# Patient Record
Sex: Male | Born: 2000 | Race: White | Hispanic: No | Marital: Single | Smoking: Never smoker
Health system: Southern US, Community
[De-identification: ages and names within clinical notes are randomized; demographics above are authoritative.]

## PROBLEM LIST (undated history)

## (undated) DIAGNOSIS — N289 Disorder of kidney and ureter, unspecified: Secondary | ICD-10-CM

## (undated) DIAGNOSIS — J45909 Unspecified asthma, uncomplicated: Secondary | ICD-10-CM

## (undated) HISTORY — PX: WISDOM TOOTH EXTRACTION: SHX21

---
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
## 2020-06-09 ENCOUNTER — Other Ambulatory Visit: Payer: Self-pay

## 2020-06-09 ENCOUNTER — Ambulatory Visit
Admission: EM | Admit: 2020-06-09 | Discharge: 2020-06-09 | Disposition: A | Discharge status: Home / Self Care | Payer: Managed Care, Other (non HMO) | Attending: Emergency Medicine | Admitting: Emergency Medicine

## 2020-06-09 ENCOUNTER — Encounter: Payer: Self-pay | Admitting: Emergency Medicine

## 2020-06-09 ENCOUNTER — Ambulatory Visit: Payer: Self-pay

## 2020-06-09 ENCOUNTER — Encounter: Payer: Self-pay | Admitting: Family Medicine

## 2020-06-09 DIAGNOSIS — R6889 Other general symptoms and signs: Secondary | ICD-10-CM | POA: Diagnosis not present

## 2020-06-09 HISTORY — DX: Unspecified asthma, uncomplicated: J45.909

## 2020-06-09 LAB — POCT URINALYSIS DIP (MANUAL ENTRY)
Bilirubin, UA: NEGATIVE
Glucose, UA: NEGATIVE mg/dL
Ketones, POC UA: NEGATIVE mg/dL
Leukocytes, UA: NEGATIVE
Nitrite, UA: NEGATIVE
Protein Ur, POC: 30 mg/dL — AB
Spec Grav, UA: 1.025 (ref 1.010–1.025)
Urobilinogen, UA: 1 E.U./dL
pH, UA: 5.5 (ref 5.0–8.0)

## 2020-06-09 NOTE — ED Provider Notes (Signed)
[facility]   [id number] 06/09/20 Arrival Time: 0946   CC: Flu like symptoms  SUBJECTIVE: History from: patient and mother  Little Winton is a 19 y.o. male who presents with fever, tmax of 101, chills and generalized body aches x 1 day.  Patient tested positive for COVID in March.  Denies known sick contacts or exposure.  Has tried tylenol with relief.  Denies previous symptoms in the past.   Denies sinus pain, rhinorrhea, sore throat, cough,  SOB, wheezing, chest pain, nausea, abdominal pain, changes in bowel or bladder habits, tick bite.     ROS: As per HPI.  All other pertinent ROS negative.     Past Medical History:  Diagnosis Date  . Asthma    History reviewed. No pertinent surgical history. Allergies  Allergen Reactions  . Wasp Venom Anaphylaxis  . Amoxicillin Hives  . Ibuprofen Other (See Comments)    Finger tips swell    No current facility-administered medications on file prior to encounter.   No current outpatient medications on file prior to encounter.   Social History   Socioeconomic History  . Marital status: Single    Spouse name: Not on file  . Number of children: Not on file  . Years of education: Not on file  . Highest education level: Not on file  Occupational History  . Not on file  Tobacco Use  . Smoking status: Never Smoker  . Smokeless tobacco: Never Used  Substance and Sexual Activity  . Alcohol use: Never  . Drug use: Never  . Sexual activity: Not on file  Other Topics Concern  . Not on file  Social History Narrative  . Not on file   Social Determinants of Health   Financial Resource Strain:   . Difficulty of Paying Living Expenses:   Food Insecurity:   . Worried About Programme researcher, broadcasting/film/video in the Last Year:   . Barista in the Last Year:   Transportation Needs:   . Freight forwarder (Medical):   Marland Kitchen Lack of Transportation (Non-Medical):   Physical Activity:   . Days of Exercise per Week:   . Minutes of  Exercise per Session:   Stress:   . Feeling of Stress :   Social Connections:   . Frequency of Communication with Friends and Family:   . Frequency of Social Gatherings with Friends and Family:   . Attends Religious Services:   . Active Member of Clubs or Organizations:   . Attends Banker Meetings:   Marland Kitchen Marital Status:   Intimate Partner Violence:   . Fear of Current or Ex-Partner:   . Emotionally Abused:   Marland Kitchen Physically Abused:   . Sexually Abused:    No family history on file.  OBJECTIVE:  Vitals:   06/09/20 1006 06/09/20 1009  BP:  104/68  Pulse:  85  Resp:  18  Temp:  99.5 F (37.5 C)  TempSrc:  Oral  SpO2:  97%  Weight: 150 lb (68 kg)   Height: 6\' 1"  (1.854 m)      General appearance: alert; appears mildly fatigued, but nontoxic; speaking in full sentences and tolerating own secretions HEENT: NCAT; Ears: EACs clear, TMs pearly gray; Eyes: PERRL.  EOM grossly intact. Nose: nares patent without rhinorrhea, Throat: oropharynx clear, tonsils non erythematous or enlarged, uvula midline  Neck: supple without LAD Lungs: unlabored respirations, symmetrical air entry; cough: absent; no respiratory distress; CTAB Heart: regular rate and rhythm.  Skin: warm and dry Psychological: alert and cooperative; normal mood and affect  LABS:  Results for orders placed or performed during the hospital encounter of 06/09/20 (from the past 24 hour(s))  POCT urinalysis dipstick     Status: Abnormal   Collection Time: 06/09/20 10:26 AM  Result Value Ref Range   Color, UA straw (A) yellow   Clarity, UA clear clear   Glucose, UA negative negative mg/dL   Bilirubin, UA negative negative   Ketones, POC UA negative negative mg/dL   Spec Grav, UA 4.132 4.401 - 1.025   Blood, UA trace-intact (A) negative   pH, UA 5.5 5.0 - 8.0   Protein Ur, POC =30 (A) negative mg/dL   Urobilinogen, UA 1.0 0.2 or 1.0 E.U./dL   Nitrite, UA Negative Negative   Leukocytes, UA Negative Negative      ASSESSMENT & PLAN:  1. Flu-like symptoms    Urine did not show signs of infection today.  It showed some protein and trace blood, possibly secondary to dehydration Please follow up with PCP in 1-2 weeks to have urine rechecked Get plenty of rest and push fluids Supplement with pedialyte OTC as needed Continue with tylenol for fever Follow up with PCP as needed Call or go to the ED if you have any new or worsening symptoms such as worsening fever, cough, shortness of breath, chest tightness, chest pain, turning blue, changes in mental status, abdominal pain, changes in urinary or bowel habits, etc...   Reviewed expectations re: course of current medical issues. Questions answered. Outlined signs and symptoms indicating need for more acute intervention. Patient verbalized understanding. After Visit Summary given.         Rennis Harding, PA-C 06/09/20 1053

## 2020-06-09 NOTE — ED Triage Notes (Addendum)
Fever, chill and body aches that started Tue morning.  Pt had covid in March

## 2020-06-09 NOTE — Discharge Instructions (Signed)
Urine did not show signs of infection today.  It showed some protein and trace blood, possibly secondary to dehydration Please follow up with PCP in 1-2 weeks to have urine rechecked Get plenty of rest and push fluids Supplement with pedialyte OTC as needed Continue with tylenol for fever Follow up with PCP as needed Call or go to the ED if you have any new or worsening symptoms such as worsening fever, cough, shortness of breath, chest tightness, chest pain, turning blue, changes in mental status, abdominal pain, changes in urinary or bowel habits, etc..Marland Kitchen

## 2020-06-10 ENCOUNTER — Other Ambulatory Visit: Payer: Self-pay

## 2020-06-10 ENCOUNTER — Ambulatory Visit
Admission: EM | Admit: 2020-06-10 | Discharge: 2020-06-10 | Disposition: A | Discharge status: Home / Self Care | Payer: Managed Care, Other (non HMO) | Source: Home / Self Care

## 2020-06-10 ENCOUNTER — Emergency Department (HOSPITAL_COMMUNITY): Payer: Managed Care, Other (non HMO)

## 2020-06-10 ENCOUNTER — Encounter (HOSPITAL_COMMUNITY): Payer: Self-pay | Admitting: Emergency Medicine

## 2020-06-10 ENCOUNTER — Emergency Department (HOSPITAL_COMMUNITY)
Admission: EM | Admit: 2020-06-10 | Discharge: 2020-06-11 | Disposition: A | Discharge status: Home / Self Care | Payer: Managed Care, Other (non HMO) | Attending: Emergency Medicine | Admitting: Emergency Medicine

## 2020-06-10 DIAGNOSIS — N50819 Testicular pain, unspecified: Secondary | ICD-10-CM

## 2020-06-10 DIAGNOSIS — Z20822 Contact with and (suspected) exposure to covid-19: Secondary | ICD-10-CM | POA: Diagnosis not present

## 2020-06-10 DIAGNOSIS — N50811 Right testicular pain: Secondary | ICD-10-CM | POA: Insufficient documentation

## 2020-06-10 DIAGNOSIS — H6121 Impacted cerumen, right ear: Secondary | ICD-10-CM | POA: Diagnosis not present

## 2020-06-10 DIAGNOSIS — B349 Viral infection, unspecified: Secondary | ICD-10-CM | POA: Diagnosis not present

## 2020-06-10 DIAGNOSIS — J45909 Unspecified asthma, uncomplicated: Secondary | ICD-10-CM | POA: Insufficient documentation

## 2020-06-10 DIAGNOSIS — R509 Fever, unspecified: Secondary | ICD-10-CM | POA: Diagnosis not present

## 2020-06-10 DIAGNOSIS — N50812 Left testicular pain: Secondary | ICD-10-CM | POA: Diagnosis not present

## 2020-06-10 LAB — SARS CORONAVIRUS 2 BY RT PCR (HOSPITAL ORDER, PERFORMED IN ~~LOC~~ HOSPITAL LAB): SARS Coronavirus 2: NEGATIVE

## 2020-06-10 NOTE — ED Triage Notes (Signed)
Patient states that he has been sick over the last few days with body aches and fever. Chief complaint is testicular pain to the left side. Patient states that the area is red.

## 2020-06-10 NOTE — ED Triage Notes (Signed)
Patient complaining of testicular pain and worsening symptoms since last visit. Per provider Avegno, patient advised to seek care at the ED for further evaluation and possible ultrasound. Patient and dad verbalized understanding. Denied ambulance, dad driving patient to ED. Patient in no acute distress, ambulatory.

## 2020-06-10 NOTE — ED Provider Notes (Signed)
[facility] EMERGENCY DEPARTMENT Provider Note   CSN: [id number] Arrival date & time: 06/10/20  2012     History Chief Complaint  Patient presents with  . testicular pain    Mark Irwin is a 19 y.o. male.  Patient with past medical history of asthma.  States 2 days of intermittent body aches, chills, fatigue, fever up to 102.5.  Did test positive for Covid in March.  He has had no sick contacts or exposure.  Sent from urgent care today with increasing bilateral testicular pain over the past 2 days.  He believes he has been sore there all along.  Still having aches all over and chills.  Denies any pain with urination or blood in the urine.  No abdominal pain or back pain.  No vomiting.  No penile discharge.  He is not sexually active and never has been.  No known tick bites or exposures.  He was sent from urgent care again today with worsening aches and bilateral testicular pain.  He believes his testicles more red than usual.  Left side is more painful than the right.  Denies abdominal pain or back pain.  Denies any cough, sore throat, runny nose or fever.  No known tick bites.  The history is provided by the patient.       Past Medical History:  Diagnosis Date  . Asthma   . Renal disorder    kidney stone    Patient Active Problem List   Diagnosis Date Noted  . Kidney stone 10/31/2016    History reviewed. No pertinent surgical history.     History reviewed. No pertinent family history.  Social History   Tobacco Use  . Smoking status: Never Smoker  . Smokeless tobacco: Never Used  Substance Use Topics  . Alcohol use: Never  . Drug use: Never    Home Medications Prior to Admission medications   Medication Sig Start Date End Date Taking? Authorizing Provider  albuterol (PROVENTIL HFA;VENTOLIN HFA) 108 (90 Base) MCG/ACT inhaler Two sprays Q 4-6 prn prn wheezing. 02/09/19   Merlyn Albert, MD  HYDROcodone-acetaminophen (NORCO/VICODIN) 5-325 MG tablet Take one  every 6 hours for pain not helped by motrin Patient not taking: Reported on 02/09/2019 07/21/17   Bethann Berkshire, MD  ibuprofen (ADVIL,MOTRIN) 800 MG tablet Take 1 tablet (800 mg total) by mouth every 8 (eight) hours as needed for moderate pain. Patient not taking: Reported on 02/09/2019 07/21/17   Bethann Berkshire, MD  loratadine (CLARITIN) 10 MG tablet Take 10 mg by mouth daily.    [provider]  ondansetron (ZOFRAN ODT) 4 MG disintegrating tablet 4mg  ODT q4 hours prn nausea/vomit 07/21/17   07/23/17, MD    Allergies    Bee venom, Wasp venom, Amoxil [amoxicillin], Cats claw [uncaria tomentosa (cats claw)], and Ibuprofen  Review of Systems   Review of Systems  Constitutional: Positive for activity change, appetite change, chills, fatigue and fever.  HENT: Negative for congestion, rhinorrhea, sore throat and trouble swallowing.   Respiratory: Negative for cough, chest tightness and shortness of breath.   Cardiovascular: Negative for chest pain and leg swelling.  Gastrointestinal: Negative for abdominal pain, nausea, rectal pain and vomiting.  Genitourinary: Positive for testicular pain. Negative for dysuria and hematuria.  Musculoskeletal: Positive for arthralgias and myalgias.  Skin: Negative for rash.  Neurological: Positive for weakness. Negative for dizziness and headaches.   all other systems are negative except as noted in the HPI and PMH.  Physical Exam Updated Vital Signs BP (!) 142/85 (BP Location: Right Arm)   Pulse 90   Temp 99.4 F (37.4 C) (Oral)   Resp 18   Ht 6\' 1"  (1.854 m)   Wt 68 kg   SpO2 100%   BMI 19.79 kg/m   Physical Exam Vitals and nursing note reviewed.  Constitutional:      General: He is not in acute distress.    Appearance: Normal appearance. He is well-developed and normal weight. He is not ill-appearing.  HENT:     Head: Normocephalic and atraumatic.     Ears:     Comments: Cerumen impaction on right, left TM is clear    Nose:  No rhinorrhea.     Mouth/Throat:     Mouth: Mucous membranes are moist.     Pharynx: No oropharyngeal exudate or posterior oropharyngeal erythema.     Comments: Uvula midline, no exudates or asymmetry Eyes:     Conjunctiva/sclera: Conjunctivae normal.     Pupils: Pupils are equal, round, and reactive to light.  Neck:     Comments: No meningismus. Cardiovascular:     Rate and Rhythm: Normal rate and regular rhythm.     Heart sounds: Normal heart sounds. No murmur heard.   Pulmonary:     Effort: Pulmonary effort is normal. No respiratory distress.     Breath sounds: Normal breath sounds.  Abdominal:     Palpations: Abdomen is soft.     Tenderness: There is no abdominal tenderness. There is no guarding or rebound.  Genitourinary:    Comments: Testicles normal to inspection.  Diffusely tender.  Mild diffuse erythema of scrotum. No palpable lymph nodes. Musculoskeletal:        General: No tenderness. Normal range of motion.     Cervical back: Normal range of motion and neck supple.  Skin:    General: Skin is warm.     Capillary Refill: Capillary refill takes less than 2 seconds.  Neurological:     General: No focal deficit present.     Mental Status: He is alert and oriented to person, place, and time. Mental status is at baseline.     Cranial Nerves: No cranial nerve deficit.     Motor: No abnormal muscle tone.     Coordination: Coordination normal.     Comments: No ataxia on finger to nose bilaterally. No pronator drift. 5/5 strength throughout. CN 2-12 intact.Equal grip strength. Sensation intact.   Psychiatric:        Behavior: Behavior normal.     ED Results / Procedures / Treatments   Labs (all labs ordered are listed, but only abnormal results are displayed) Labs Reviewed  CBC WITH DIFFERENTIAL/PLATELET - Abnormal; Notable for the following components:      Result Value   WBC 2.6 (*)    Platelets 137 (*)    Neutro Abs 1.6 (*)    Lymphs Abs 0.6 (*)    All other  components within normal limits  COMPREHENSIVE METABOLIC PANEL - Abnormal; Notable for the following components:   Glucose, Bld 115 (*)    All other components within normal limits  SARS CORONAVIRUS 2 BY RT PCR (HOSPITAL ORDER, PERFORMED IN [facility])  CULTURE, BLOOD (ROUTINE X 2)  CULTURE, BLOOD (ROUTINE X 2)  URINE CULTURE  URINALYSIS, ROUTINE W REFLEX MICROSCOPIC  MONONUCLEOSIS SCREEN  LACTIC ACID, PLASMA  ROCKY MTN SPOTTED FVR ABS PNL(IGG+IGM)  B. BURGDORFI ANTIBODIES  MUMPS ANTIBODY, IGG  MUMPS ANTIBODY,  IGM    EKG None  Radiology DG Chest 2 View  Result Date: 06/11/2020 CLINICAL DATA:  Body aches and fever x several days. EXAM: CHEST - 2 VIEW COMPARISON:  None. FINDINGS: There is no evidence of acute infiltrate, pleural effusion or pneumothorax. The heart size and mediastinal contours are within normal limits. The visualized skeletal structures are unremarkable. IMPRESSION: No active cardiopulmonary disease. Electronically Signed   By: Aram Candelahaddeus  Houston M.D.   On: 06/11/2020 00:58   US SCROTUM W/DOPPLER  Result Date: 06/11/2020 CLINICAL DATA:  Bilateral testicular pain, left greater than right. EXAM: SCROTAL ULTRASOUND DOPPLER ULTRASOUND OF THE TESTICLES TECHNIQUE: Complete ultrasound examination of the testicles, epididymis, and other scrotal structures was performed. Color and spectral Doppler ultrasound were also utilized to evaluate blood flow to the testicles. COMPARISON:  None. FINDINGS: Right testicle Measurements: 5.8 cm x 2.1 cm x 3.0 cm. No mass or microlithiasis visualized. Left testicle Measurements: 5.6 cm x 2.4 cm x 3.3 cm. No mass or microlithiasis visualized. Right epididymis: A 0.3 cm x 0.2 cm x 0.2 cm anechoic structure is seen within the right epididymis. Left epididymis:  Normal in size and appearance. Hydrocele:  Very small bilateral hydroceles are noted. Varicocele:  Bilateral varicoceles are present. Pulsed Doppler interrogation of both testes  demonstrates normal low resistance arterial and venous waveforms bilaterally. IMPRESSION: 1. Small right epididymal cyst. 2. Very small bilateral hydroceles. 3. Bilateral varicoceles. Electronically Signed   By: Aram Candelahaddeus  Houston M.D.   On: 06/11/2020 03:22    Procedures Procedures (including critical care time)  Medications Ordered in ED Medications - No data to display  ED Course  I have reviewed the triage vital signs and the nursing notes.  Pertinent labs & imaging results that were available during my care of the patient were reviewed by me and considered in my medical decision making (see chart for details).    MDM Rules/Calculators/A&P                         2 days of body aches, chills, fever to 102.  Now with bilateral testicular pain.  No pain with urination or blood in the urine. Did have COVID in March. Nontoxic-appearing   Nontoxic.  No meningismus.  Labs are reassuring with mild leukopenia.  Mild thrombocytopenia likely consistent with viral infection. No neutropenia. Urinalysis is negative.  Testicular ultrasound as above with no evidence of testicular torsion or epididymitis.  Small varicocele and hydrocele seen  Uncertain etiology of aches and fever.  Patient up-to-date on mumps vaccine.  Monospot is negative.  Blood and urine cultures are sent.  Will add on tickborne illnesses.  Patient appears well and nontoxic.  Blood cultures, rocky [place] spotted fever, Lyme disease titers are sent.  Mumps titers are sent. Will treat empirically for possible epididymitis with Rocephin and doxycycline. He denies sexual activity.   Treat supportively for suspected viral syndrome. PO hydration, antipyretics at home. Empiric antibiotics for possible component of epididymitis. UA negative. Prostatitis considered but seems less likely at this time. He declines prostate exam. He is aware of pending blood work. He is aware he may be called if results are positive. May follow results  online with  [facility]. Followup with PCP.  Return precautions discussed.   Gaynell FaceJonah S Allington was evaluated in Emergency Department on 06/10/2020 for the symptoms described in the history of present illness. He was evaluated in the context of the global COVID-19 pandemic, which necessitated consideration that  the patient might be at risk for infection with the SARS-CoV-2 virus that causes COVID-19. Institutional protocols and algorithms that pertain to the evaluation of patients at risk for COVID-19 are in a state of rapid change based on information released by regulatory bodies including the CDC and federal and state organizations. These policies and algorithms were followed during the patient's care in the ED.  Final Clinical Impression(s) / ED Diagnoses Final diagnoses:  Testicular pain  Viral syndrome  Fever, unspecified fever cause    Rx / DC Orders ED Discharge Orders    None       Suren Payne, Jeannett Senior, MD 06/11/20 [phone]

## 2020-06-11 ENCOUNTER — Emergency Department (HOSPITAL_COMMUNITY): Payer: Managed Care, Other (non HMO)

## 2020-06-11 LAB — COMPREHENSIVE METABOLIC PANEL
ALT: 21 U/L (ref 0–44)
AST: 32 U/L (ref 15–41)
Albumin: 4.3 g/dL (ref 3.5–5.0)
Alkaline Phosphatase: 63 U/L (ref 38–126)
Anion gap: 9 (ref 5–15)
BUN: 10 mg/dL (ref 6–20)
CO2: 28 mmol/L (ref 22–32)
Calcium: 9.2 mg/dL (ref 8.9–10.3)
Chloride: 100 mmol/L (ref 98–111)
Creatinine, Ser: 0.85 mg/dL (ref 0.61–1.24)
GFR calc Af Amer: >60 mL/min (ref >60)
GFR calc non Af Amer: >60 mL/min (ref >60)
Glucose, Bld: 115 mg/dL — ABNORMAL HIGH (ref 70–99)
Potassium: 3.9 mmol/L (ref 3.5–5.1)
Sodium: 137 mmol/L (ref 135–145)
Total Bilirubin: 1 mg/dL (ref 0.3–1.2)
Total Protein: 7.4 g/dL (ref 6.5–8.1)

## 2020-06-11 LAB — CBC WITH DIFFERENTIAL/PLATELET
Abs Immature Granulocytes: 0 10*3/uL (ref 0.00–0.07)
Basophils Absolute: 0 10*3/uL (ref 0.0–0.1)
Basophils Relative: 0 %
Eosinophils Absolute: 0 10*3/uL (ref 0.0–0.5)
Eosinophils Relative: 0 %
HCT: 42.7 % (ref 39.0–52.0)
Hemoglobin: 14.2 g/dL (ref 13.0–17.0)
Immature Granulocytes: 0 %
Lymphocytes Relative: 22 %
Lymphs Abs: 0.6 10*3/uL — ABNORMAL LOW (ref 0.7–4.0)
MCH: 29.6 pg (ref 26.0–34.0)
MCHC: 33.3 g/dL (ref 30.0–36.0)
MCV: 89.1 fL (ref 80.0–100.0)
Monocytes Absolute: 0.4 10*3/uL (ref 0.1–1.0)
Monocytes Relative: 15 %
Neutro Abs: 1.6 10*3/uL — ABNORMAL LOW (ref 1.7–7.7)
Neutrophils Relative %: 63 %
Platelets: 137 10*3/uL — ABNORMAL LOW (ref 150–400)
RBC: 4.79 MIL/uL (ref 4.22–5.81)
RDW: 12.6 % (ref 11.5–15.5)
WBC: 2.6 10*3/uL — ABNORMAL LOW (ref 4.0–10.5)
nRBC: 0 % (ref 0.0–0.2)

## 2020-06-11 LAB — URINALYSIS, ROUTINE W REFLEX MICROSCOPIC
Bilirubin Urine: NEGATIVE
Glucose, UA: NEGATIVE mg/dL
Hgb urine dipstick: NEGATIVE
Ketones, ur: NEGATIVE mg/dL
Leukocytes,Ua: NEGATIVE
Nitrite: NEGATIVE
Protein, ur: NEGATIVE mg/dL
Specific Gravity, Urine: 1.016 (ref 1.005–1.030)
pH: 6 (ref 5.0–8.0)

## 2020-06-11 LAB — MONONUCLEOSIS SCREEN: Mono Screen: NEGATIVE

## 2020-06-11 LAB — LACTIC ACID, PLASMA: Lactic Acid, Venous: 0.8 mmol/L (ref 0.5–1.9)

## 2020-06-11 MED ORDER — CEFTRIAXONE SODIUM 500 MG IJ SOLR
500.0000 mg | Freq: Once | INTRAMUSCULAR | Status: AC
  Administered 2020-06-11: 500 mg via INTRAMUSCULAR
  Filled 2020-06-11: qty 500

## 2020-06-11 MED ORDER — DOXYCYCLINE HYCLATE 100 MG PO CAPS
100.0000 mg | ORAL_CAPSULE | Freq: Two times a day (BID) | ORAL | 0 refills | Status: DC
Start: 2020-06-11 — End: 2020-06-15

## 2020-06-11 MED ORDER — STERILE WATER FOR INJECTION IJ SOLN
INTRAMUSCULAR | Status: AC
  Filled 2020-06-11: qty 10

## 2020-06-11 MED ORDER — DOXYCYCLINE HYCLATE 100 MG PO TABS
100.0000 mg | ORAL_TABLET | Freq: Once | ORAL | Status: AC
  Administered 2020-06-11: 100 mg via ORAL
  Filled 2020-06-11: qty 1

## 2020-06-11 NOTE — Discharge Instructions (Addendum)
take the antibiotics as prescribed.  Keep yourself hydrated.  Use Tylenol as needed for fever.  You can check online for the results of your mumps tests, [facility] spotted fever and Lyme disease tests. Return to the ED with worsening symptoms including pain, persistent fever, vomiting, any other concerns.

## 2020-06-12 LAB — URINE CULTURE: Culture: NO GROWTH

## 2020-06-12 LAB — MUMPS ANTIBODY, IGG: Mumps IgG: 60.8 AU/mL (ref >10.9)

## 2020-06-13 LAB — B. BURGDORFI ANTIBODIES: B burgdorferi Ab IgG+IgM: <0.91 {ISR} (ref 0.00–0.90)

## 2020-06-13 LAB — MUMPS ANTIBODY, IGM: Mumps IgM: <0.8 AU (ref 0.00–0.79)

## 2020-06-14 LAB — ROCKY MTN SPOTTED FVR ABS PNL(IGG+IGM)
RMSF IgG: NEGATIVE
RMSF IgM: 0.16 index (ref 0.00–0.89)

## 2020-06-15 ENCOUNTER — Ambulatory Visit (INDEPENDENT_AMBULATORY_CARE_PROVIDER_SITE_OTHER): Payer: Managed Care, Other (non HMO) | Admitting: Family Medicine

## 2020-06-15 ENCOUNTER — Other Ambulatory Visit: Payer: Self-pay

## 2020-06-15 DIAGNOSIS — R21 Rash and other nonspecific skin eruption: Secondary | ICD-10-CM | POA: Diagnosis not present

## 2020-06-15 DIAGNOSIS — T50905A Adverse effect of unspecified drugs, medicaments and biological substances, initial encounter: Secondary | ICD-10-CM

## 2020-06-15 NOTE — Progress Notes (Signed)
   Subjective:    Patient ID: Mark Irwin, male    DOB: [birth year], 19 y.o.   MRN: [id number]  Patient comes in today after ER visit over the weekend for testicle pain, fever and chills.  Patient was given doxycyline and took 1 dose Saturday and broke out in hives on his back, stomach and right arm and stopped abx. Today patient has no complaints of pain, fever and hives have cleared up.  ER note reviewed 's lab reviewed Patient did break out with rash after doxycycline Hard to know if that was the cause or not Patient does not think it was anything else Following day body aches went away testicular pain went away he did receive a ceftriaxone shot unlikely because of this but cannot rule it out completely Review of Systems     Objective:   Physical Exam  Today lungs clear respiratory rate normal heart regular no murmurs abdomen soft no rash noted Testicular exam normal epididymis normal no swelling no tenderness     Assessment & Plan:  Testicular discomfort resolved Hives reviewed resolved Possibly related to doxycycline If progressive troubles or worse to follow-up Warning signs discussed Wellness exam recommended this fall  If patient was in a pinch he could do doxycycline but if it causes rash again he would never be able to use it

## 2020-06-16 LAB — CULTURE, BLOOD (ROUTINE X 2)
Culture: NO GROWTH
Culture: NO GROWTH
Special Requests: ADEQUATE
Special Requests: ADEQUATE

## 2020-06-23 ENCOUNTER — Telehealth: Payer: Self-pay | Admitting: Family Medicine

## 2020-06-23 NOTE — Telephone Encounter (Signed)
Patient went to ER on 7/16 with private area issues and given antibiotic. He follow up here on 7/21 with same issues but had hives. Now patient has more discomfort in private area and wanting a follow up visit. He started back taking antibiotic from ER to help with discomfort. Please advise

## 2020-06-23 NOTE — Telephone Encounter (Signed)
Please advise where to put patient next week all appointment are blocked for now

## 2020-06-23 NOTE — Telephone Encounter (Signed)
May be given an open slot for tomorrow

## 2020-06-23 NOTE — Telephone Encounter (Signed)
Please contact patient and schedule appt. Thank you.

## 2020-06-24 NOTE — Telephone Encounter (Signed)
Spoke to Dr. Lorin Picket and patient scheduled for next week.

## 2020-06-24 NOTE — Telephone Encounter (Signed)
Has appointment 8/2 at 3:30

## 2020-06-27 ENCOUNTER — Ambulatory Visit (INDEPENDENT_AMBULATORY_CARE_PROVIDER_SITE_OTHER): Payer: Managed Care, Other (non HMO) | Admitting: Family Medicine

## 2020-06-27 ENCOUNTER — Other Ambulatory Visit: Payer: Self-pay

## 2020-06-27 VITALS — BP 110/68 | Temp 98.0°F | Wt 149.6 lb

## 2020-06-27 DIAGNOSIS — R300 Dysuria: Secondary | ICD-10-CM

## 2020-06-27 NOTE — Progress Notes (Signed)
   Subjective:    Patient ID: Mark Irwin, male    DOB: Jun 20, 2001, 19 y.o.   MRN: [id number]  HPI Patient comes in today with continued concerns of his private area.  Patient relates that just does not feel that everything is working the way it should He states odd sensation upon ejaculation with some numbness and some slight dysuria He is concerned about his prostate causing trouble Patient did have dysuria a few weeks ago was treated with doxycycline other than causing nausea he states he really did not feel like the medicine did much He denies much in the way of dysuria currently.  Patient not sexually active.  Review of Systems    Please see above Objective:   Physical Exam GU normal appearance no discharge noted testicles normal no lymph nodes no nodules       Assessment & Plan:  Dysuria Culture in the past was negative His main issue is a odd sensation with ejaculation I do not find evidence of any type of tumor or growth At this point in time consultation with urology seems reasonable Recommend referral to urology GC chlamydia for completeness although very unlikely it will be positive

## 2020-06-29 LAB — SPECIMEN STATUS REPORT

## 2020-06-29 LAB — GC/CHLAMYDIA PROBE AMP
Chlamydia trachomatis, NAA: NEGATIVE
Neisseria Gonorrhoeae by PCR: NEGATIVE

## 2020-06-30 ENCOUNTER — Encounter: Payer: Self-pay | Admitting: Family Medicine

## 2020-08-22 ENCOUNTER — Encounter: Payer: Self-pay | Admitting: Emergency Medicine

## 2020-08-22 ENCOUNTER — Ambulatory Visit
Admission: EM | Admit: 2020-08-22 | Discharge: 2020-08-22 | Disposition: A | Discharge status: Home / Self Care | Payer: Managed Care, Other (non HMO) | Attending: Emergency Medicine | Admitting: Emergency Medicine

## 2020-08-22 DIAGNOSIS — J029 Acute pharyngitis, unspecified: Secondary | ICD-10-CM | POA: Diagnosis present

## 2020-08-22 LAB — POCT RAPID STREP A (OFFICE): Rapid Strep A Screen: NEGATIVE

## 2020-08-22 MED ORDER — LIDOCAINE VISCOUS HCL 2 % MT SOLN
15.0000 mL | OROMUCOSAL | 0 refills | Status: DC | PRN
Start: 2020-08-22 — End: 2020-12-20

## 2020-08-22 NOTE — Discharge Instructions (Addendum)
Declines covid test at this time Strep test negative, will send out for culture and we will call you with results Declines test for mono at this time Get plenty of rest and push fluids Viscous lidocaine prescribed.  This is an oral solution you can swish, gargle, and/or swallow as needed for symptomatic relief of sore throat.  Do not exceed 8 doses in a 24 hour period.  Do not use prior to eating, as this will numb your entire mouth.    Drink warm or cool liquids, use throat lozenges, or popsicles to help alleviate symptoms Follow up with PCP if symptoms persists Return or go to ER if patient has any new or worsening symptoms such as fever, chills, nausea, vomiting, worsening sore throat, cough, abdominal pain, chest pain, changes in bowel or bladder habits, etc..Marland Kitchen

## 2020-08-22 NOTE — ED Triage Notes (Addendum)
Bad sore throat and body aches since 1030 last night. Pt had covid in March 2021

## 2020-08-22 NOTE — ED Provider Notes (Signed)
[facility]   [id number] 08/22/20 Arrival Time: 1102  EL:FYBO THROAT  SUBJECTIVE: History from: patient.  Mark Irwin is a 19 y.o. male who presents with abrupt onset of fatigue and sore throat x 1 day.  Denies sick exposure to strep, flu or mono, or precipitating event.  Denies alleviating factors.  Symptoms are made worse with swallowing, but tolerating liquids and own secretions without difficulty.  Reports COVID infection in March.  Denies fever, chills, fatigue, ear pain, sinus pain, rhinorrhea, nasal congestion, cough, SOB, wheezing, chest pain, nausea, rash, changes in bowel or bladder habits.    ROS: As per HPI.  All other pertinent ROS negative.     Past Medical History:  Diagnosis Date  . Asthma    exercise induced   . Renal disorder    kidney stone   History reviewed. No pertinent surgical history. Allergies  Allergen Reactions  . Bee Venom Anaphylaxis  . Wasp Venom Anaphylaxis  . Doxycycline     Broke out with rash as well as hives after taking doxycycline for 1 day  . Amoxil [Amoxicillin] Hives    Has patient had a PCN reaction causing immediate rash, facial/tongue/throat swelling, SOB or lightheadedness with hypotension: Yes Has patient had a PCN reaction causing severe rash involving mucus membranes or skin necrosis: No Has patient had a PCN reaction that required hospitalization: No Has patient had a PCN reaction occurring within the last 10 years: Yes If all of the above answers are "NO", then may proceed with Cephalosporin use.   . Cats Claw [Uncaria Tomentosa (Cats Claw)] Itching  . Ibuprofen Other (See Comments)    Finger tips swell    No current facility-administered medications on file prior to encounter.   Current Outpatient Medications on File Prior to Encounter  Medication Sig Dispense Refill  . albuterol (PROVENTIL HFA;VENTOLIN HFA) 108 (90 Base) MCG/ACT inhaler Two sprays Q 4-6 prn prn wheezing. 1 Inhaler 0  . [DISCONTINUED]  loratadine (CLARITIN) 10 MG tablet Take 10 mg by mouth daily. (Patient not taking: Reported on 06/27/2020)     Social History   Socioeconomic History  . Marital status: Single    Spouse name: Not on file  . Number of children: Not on file  . Years of education: Not on file  . Highest education level: Not on file  Occupational History  . Not on file  Tobacco Use  . Smoking status: Never Smoker  . Smokeless tobacco: Never Used  Substance and Sexual Activity  . Alcohol use: Never  . Drug use: Never  . Sexual activity: Not on file  Other Topics Concern  . Not on file  Social History Narrative   ** Merged History Encounter **       Social Determinants of Health   Financial Resource Strain:   . Difficulty of Paying Living Expenses: Not on file  Food Insecurity:   . Worried About Programme researcher, broadcasting/film/video in the Last Year: Not on file  . Ran Out of Food in the Last Year: Not on file  Transportation Needs:   . Lack of Transportation (Medical): Not on file  . Lack of Transportation (Non-Medical): Not on file  Physical Activity:   . Days of Exercise per Week: Not on file  . Minutes of Exercise per Session: Not on file  Stress:   . Feeling of Stress : Not on file  Social Connections:   . Frequency of Communication with Friends and Family: Not on file  .  Frequency of Social Gatherings with Friends and Family: Not on file  . Attends Religious Services: Not on file  . Active Member of Clubs or Organizations: Not on file  . Attends Banker Meetings: Not on file  . Marital Status: Not on file  Intimate Partner Violence:   . Fear of Current or Ex-Partner: Not on file  . Emotionally Abused: Not on file  . Physically Abused: Not on file  . Sexually Abused: Not on file   No family history on file.  OBJECTIVE:  Vitals:   08/22/20 1149 08/22/20 1150  BP: 111/71   Pulse: (!) 107   Resp: 19   Temp: 99.1 F (37.3 C)   TempSrc: Oral   SpO2: 97%   Weight:  155 lb (70.3 kg)   Height:  6\' 1"  (1.854 m)     General appearance: alert; appears mildly fatigued, but nontoxic, speaking in full sentences and managing own secretions HEENT: NCAT; Ears: EACs clear, TMs pearly gray with visible cone of light, without erythema; Eyes: PERRL, EOMI grossly; Nose: no obvious rhinorrhea; Throat: oropharynx clear, tonsils 1+ and erythematous without white tonsillar exudates, uvula midline Neck: supple without LAD Lungs: CTA bilaterally without adventitious breath sounds; cough absent Heart: regular rate and rhythm.   Skin: warm and dry Psychological: alert and cooperative; normal mood and affect  LABS: Results for orders placed or performed during the hospital encounter of 08/22/20 (from the past 24 hour(s))  POCT rapid strep A     Status: None   Collection Time: 08/22/20 11:56 AM  Result Value Ref Range   Rapid Strep A Screen Negative Negative     ASSESSMENT & PLAN:  1. Sore throat   2. Viral pharyngitis     Meds ordered this encounter  Medications  . lidocaine (XYLOCAINE) 2 % solution    Sig: Use as directed 15 mLs in the mouth or throat as needed for mouth pain (Do NOT exceed 8 doses in a 24 hour period).    Dispense:  100 mL    Refill:  0    Order Specific Question:   Supervising Provider    Answer:   08/24/20 Eustace Moore   Declines covid test at this time Strep test negative, will send out for culture and we will call you with results Declines test for mono at this time Get plenty of rest and push fluids Viscous lidocaine prescribed.  This is an oral solution you can swish, gargle, and/or swallow as needed for symptomatic relief of sore throat.  Do not exceed 8 doses in a 24 hour period.  Do not use prior to eating, as this will numb your entire mouth.    Drink warm or cool liquids, use throat lozenges, or popsicles to help alleviate symptoms Follow up with PCP if symptoms persists Return or go to ER if patient has any new or worsening symptoms such as  fever, chills, nausea, vomiting, worsening sore throat, cough, abdominal pain, chest pain, changes in bowel or bladder habits, etc...  Reviewed expectations re: course of current medical issues. Questions answered. Outlined signs and symptoms indicating need for more acute intervention. Patient verbalized understanding. After Visit Summary given.        [[id number]], PA-C 08/22/20 1225

## 2020-08-23 ENCOUNTER — Ambulatory Visit: Payer: Managed Care, Other (non HMO) | Admitting: Urology

## 2020-08-25 LAB — CULTURE, GROUP A STREP (THRC)

## 2020-12-20 ENCOUNTER — Ambulatory Visit (INDEPENDENT_AMBULATORY_CARE_PROVIDER_SITE_OTHER): Payer: Managed Care, Other (non HMO) | Admitting: Urology

## 2020-12-20 ENCOUNTER — Other Ambulatory Visit: Payer: Self-pay

## 2020-12-20 ENCOUNTER — Encounter: Payer: Self-pay | Admitting: Urology

## 2020-12-20 VITALS — BP 123/74 | HR 84 | Temp 97.8°F

## 2020-12-20 DIAGNOSIS — N50811 Right testicular pain: Secondary | ICD-10-CM

## 2020-12-20 DIAGNOSIS — N200 Calculus of kidney: Secondary | ICD-10-CM | POA: Diagnosis not present

## 2020-12-20 DIAGNOSIS — R300 Dysuria: Secondary | ICD-10-CM | POA: Diagnosis not present

## 2020-12-20 DIAGNOSIS — N50812 Left testicular pain: Secondary | ICD-10-CM | POA: Diagnosis not present

## 2020-12-20 LAB — URINALYSIS, ROUTINE W REFLEX MICROSCOPIC
Bilirubin, UA: NEGATIVE
Glucose, UA: NEGATIVE
Ketones, UA: NEGATIVE
Leukocytes,UA: NEGATIVE
Nitrite, UA: NEGATIVE
Protein,UA: NEGATIVE
RBC, UA: NEGATIVE
Specific Gravity, UA: 1.025 (ref 1.005–1.030)
Urobilinogen, Ur: 0.2 mg/dL (ref 0.2–1.0)
pH, UA: 5.5 (ref 5.0–7.5)

## 2020-12-20 NOTE — Progress Notes (Signed)
Urological Symptom Review  Patient is experiencing the following symptoms:    Review of Systems  Gastrointestinal (upper)  : Negative for upper GI symptoms  Gastrointestinal (lower) : Negative for lower GI symptoms  Constitutional : Negative for symptoms  Skin: Negative for skin symptoms  Eyes: Negative for eye symptoms  Ear/Nose/Throat : Negative for Ear/Nose/Throat symptoms  Hematologic/Lymphatic: Negative for Hematologic/Lymphatic symptoms  Cardiovascular : Negative for cardiovascular symptoms  Respiratory : Negative for respiratory symptoms  Endocrine: Negative for endocrine symptoms  Musculoskeletal: Negative for musculoskeletal symptoms  Neurological: Negative for neurological symptoms  Psychologic: Negative for psychiatric symptoms  

## 2020-12-20 NOTE — H&P (Signed)
H&P  Chief Complaint:  Multiple complaints  History of Present Illness: 20 year old healthy male presents today with multiple urologic complaints.  The patient states that he came down with Covid in March, 2021.  At that point, he had no real urologic issues.  He states that he had a repeat episode in August of this year.  At that point, he said that his scrotal skin turned red, he had significant swelling and pain in both testicles.  This was relatively short-lived, perhaps about a week or so.  He did say that he took doxycycline.  At that point chlamydia and gonorrhea tests were negative.  The patient states that since that time, his scrotal skin/testicles have been more relaxed, he has fewer erections, he has less noticeable ejaculation, and he also has delayed ejaculation.  The patient has no other longstanding urologic history other than passing 2 stones in 2018.  Past Medical History:  Diagnosis Date  . Asthma    exercise induced   . Renal disorder    kidney stone    No past surgical history on file.  Home Medications:  Allergies as of 12/20/2020      Reactions   Bee Venom Anaphylaxis   Wasp Venom Anaphylaxis   Doxycycline    Broke out with rash as well as hives after taking doxycycline for 1 day   Amoxil [amoxicillin] Hives   Has patient had a PCN reaction causing immediate rash, facial/tongue/throat swelling, SOB or lightheadedness with hypotension: Yes Has patient had a PCN reaction causing severe rash involving mucus membranes or skin necrosis: No Has patient had a PCN reaction that required hospitalization: No Has patient had a PCN reaction occurring within the last 10 years: Yes If all of the above answers are "NO", then may proceed with Cephalosporin use.   Cats Claw [uncaria Tomentosa (cats Claw)] Itching   Ibuprofen Other (See Comments)   Finger tips swell       Medication List       Accurate as of December 20, 2020  9:57 AM. If you have any questions, ask your  nurse or doctor.        STOP taking these medications   lidocaine 2 % solution Commonly known as: XYLOCAINE Stopped by: Chelsea Aus, MD     TAKE these medications   albuterol 108 (90 Base) MCG/ACT inhaler Commonly known as: VENTOLIN HFA Two sprays Q 4-6 prn prn wheezing.       Allergies:  Allergies  Allergen Reactions  . Bee Venom Anaphylaxis  . Wasp Venom Anaphylaxis  . Doxycycline     Broke out with rash as well as hives after taking doxycycline for 1 day  . Amoxil [Amoxicillin] Hives    Has patient had a PCN reaction causing immediate rash, facial/tongue/throat swelling, SOB or lightheadedness with hypotension: Yes Has patient had a PCN reaction causing severe rash involving mucus membranes or skin necrosis: No Has patient had a PCN reaction that required hospitalization: No Has patient had a PCN reaction occurring within the last 10 years: Yes If all of the above answers are "NO", then may proceed with Cephalosporin use.   . Cats Claw [Uncaria Tomentosa (Cats Claw)] Itching  . Ibuprofen Other (See Comments)    Finger tips swell     No family history on file.  Social History:  reports that he has never smoked. He has never used smokeless tobacco. He reports that he does not drink alcohol and does not use drugs.  ROS:  A complete review of systems was performed.  All systems are negative except for pertinent findings as noted.  Physical Exam:  Vital signs in last 24 hours: BP 123/74   Pulse 84   Temp 97.8 F (36.6 C)  Constitutional:  Alert and oriented, No acute distress Cardiovascular: Regular rate  Respiratory: Normal respiratory effort GI: Abdomen is soft, nontender, nondistended, no abdominal masses. No CVAT.  There are no inguinal hernias. Genitourinary: Normal male phallus, testes are descended bilaterally and non-tender and without masses, scrotum is normal in appearance without lesions or masses, perineum is normal on inspection. Lymphatic: No  lymphadenopathy Neurologic: Grossly intact, no focal deficits Psychiatric: Normal mood and affect  I have reviewed prior pt notes  I have reviewed notes from referring/previous physicians  I have reviewed urinalysis results  I have independently reviewed prior imaging--CT scan from 2018  I have reviewed prior STD tests.   Impression/Assessment:  Testicular/scrotal discomfort, questionable problems with the ED.  Functional in nature.  Plan:  1.  I have strongly told the patient to limit his online reading of urologic symptoms  2.  I have reassured him that he has a normal exam  3.  I do not think he needs further urologic evaluation  4.  I did give him stone prevention guidelines i.e. limiting sodium in diet, increasing fluids, decreasing caffeine, decreasing meat portions  5.  Office visit as needed

## 2021-04-27 ENCOUNTER — Other Ambulatory Visit: Payer: Self-pay

## 2021-04-27 NOTE — Telephone Encounter (Signed)
Needs refill on albuterol (PROVENTIL HFA;VENTOLIN HFA) 108 (90 Base) MCG/ACT inhaler [[id number]] [facility] - [place], [place] - [address]   Pt call [phone] Lauralyn Primes Father

## 2021-05-02 MED ORDER — ALBUTEROL SULFATE HFA 108 (90 BASE) MCG/ACT IN AERS: 2.0000 | INHALATION_SPRAY | Freq: Four times a day (QID) | RESPIRATORY_TRACT | 0 refills | Status: DC | PRN

## 2021-05-02 NOTE — Telephone Encounter (Signed)
Left voicemail to return call to schedule appt °

## 2021-05-02 NOTE — Telephone Encounter (Signed)
Pt father called in I explained to him Mark Irwin needed appt he said that the inhaler is when needed due to exercise and said he was having his wisdom teeth cut out tomorrow and needed his inhaler.   Call back [phone]

## 2021-05-02 NOTE — Telephone Encounter (Signed)
Please advise. Thank you

## 2021-05-19 ENCOUNTER — Ambulatory Visit
Admission: RE | Admit: 2021-05-19 | Discharge: 2021-05-19 | Disposition: A | Discharge status: Home / Self Care | Payer: Managed Care, Other (non HMO) | Source: Ambulatory Visit | Attending: Emergency Medicine | Admitting: Emergency Medicine

## 2021-05-19 ENCOUNTER — Telehealth: Payer: Self-pay | Admitting: *Deleted

## 2021-05-19 ENCOUNTER — Other Ambulatory Visit: Payer: Self-pay

## 2021-05-19 VITALS — BP 107/71 | HR 74 | Temp 98.5°F | Resp 16

## 2021-05-19 DIAGNOSIS — R0981 Nasal congestion: Secondary | ICD-10-CM | POA: Diagnosis not present

## 2021-05-19 MED ORDER — SULFAMETHOXAZOLE-TRIMETHOPRIM 800-160 MG PO TABS
1.0000 | ORAL_TABLET | Freq: Two times a day (BID) | ORAL | 0 refills | Status: AC
Start: 2021-05-19 — End: 2021-05-29

## 2021-05-19 NOTE — ED Provider Notes (Signed)
[facility]   [id number] 05/19/21 Arrival Time: 1048   CC: Cough sinus congestion  SUBJECTIVE: History from: patient.  Mark Irwin is a 20 y.o. male who presents with scratchy throat, fever, chest and sinus congestion x 4- 5 days ago.  Denies sick exposure to COVID, flu or strep.  Has tried OTC medications.  Denies alleviating or aggravating factors.  Reports previous symptoms in the past in sinus congestion.   Denies chills, SOB, wheezing, chest pain, nausea, changes in bowel or bladder habits.    ROS: As per HPI.  All other pertinent ROS negative.     Past Medical History:  Diagnosis Date   Asthma    exercise induced    Renal disorder    kidney stone   Past Surgical History:  Procedure Laterality Date   WISDOM TOOTH EXTRACTION     Allergies  Allergen Reactions   Bee Venom Anaphylaxis   Wasp Venom Anaphylaxis   Doxycycline     Broke out with rash as well as hives after taking doxycycline for 1 day   Amoxil [Amoxicillin] Hives    Has patient had a PCN reaction causing immediate rash, facial/tongue/throat swelling, SOB or lightheadedness with hypotension: Yes Has patient had a PCN reaction causing severe rash involving mucus membranes or skin necrosis: No Has patient had a PCN reaction that required hospitalization: No Has patient had a PCN reaction occurring within the last 10 years: Yes If all of the above answers are "NO", then may proceed with Cephalosporin use.    Cats Claw [Uncaria Tomentosa (Cats Claw)] Itching   Ibuprofen Other (See Comments)    Finger tips swell    No current facility-administered medications on file prior to encounter.   Current Outpatient Medications on File Prior to Encounter  Medication Sig Dispense Refill   albuterol (VENTOLIN HFA) 108 (90 Base) MCG/ACT inhaler Inhale 2 puffs into the lungs every 6 (six) hours as needed (exercising). 8 g 0   [DISCONTINUED] loratadine (CLARITIN) 10 MG tablet Take 10 mg by mouth daily.  (Patient not taking: Reported on 06/27/2020)     Social History   Socioeconomic History   Marital status: Single    Spouse name: Not on file   Number of children: Not on file   Years of education: Not on file   Highest education level: Not on file  Occupational History   Not on file  Tobacco Use   Smoking status: Never   Smokeless tobacco: Never  Substance and Sexual Activity   Alcohol use: Never   Drug use: Never   Sexual activity: Not on file  Other Topics Concern   Not on file  Social History Narrative   ** Merged History Encounter **       Social Determinants of Health   Financial Resource Strain: Not on file  Food Insecurity: Not on file  Transportation Needs: Not on file  Physical Activity: Not on file  Stress: Not on file  Social Connections: Not on file  Intimate Partner Violence: Not on file   No family history on file.  OBJECTIVE:  Vitals:   05/19/21 1131  BP: 107/71  Pulse: 74  Resp: 16  Temp: 98.5 F (36.9 C)  TempSrc: Tympanic  SpO2: 98%    General appearance: alert; mildly fatigued appearing, nontoxic; speaking in full sentences and tolerating own secretions HEENT: NCAT; Ears: EACs clear, TMs pearly gray; Eyes: PERRL.  EOM grossly intact.Nose: nares patent without rhinorrhea, Throat: oropharynx clear, tonsils non erythematous  or enlarged, uvula midline  Neck: supple without LAD Lungs: unlabored respirations, symmetrical air entry; cough: absent; no respiratory distress; CTAB Heart: regular rate and rhythm.  Skin: warm and dry Psychological: alert and cooperative; normal mood and affect   ASSESSMENT & PLAN:  1. Sinus congestion     Meds ordered this encounter  Medications   sulfamethoxazole-trimethoprim (BACTRIM DS) 800-160 MG tablet    Sig: Take 1 tablet by mouth 2 (two) times daily for 10 days.    Dispense:  20 tablet    Refill:  0    Order Specific Question:   Supervising Provider    Answer:   Eustace Moore [[id number]]   Get  plenty of rest and push fluids Tessalon Perles prescribed for cough Bactrim for sinus infection Use OTC zyrtec for nasal congestion, runny nose, and/or sore throat Use OTC flonase for nasal congestion and runny nose Use medications daily for symptom relief Use OTC medications like ibuprofen or tylenol as needed fever or pain Call or go to the ED if you have any new or worsening symptoms such as fever, worsening cough, shortness of breath, chest tightness, chest pain, turning blue, changes in mental status, etc...   Reviewed expectations re: course of current medical issues. Questions answered. Outlined signs and symptoms indicating need for more acute intervention. Patient verbalized understanding. After Visit Summary given.          Rennis Harding, PA-C 05/19/21 1213

## 2021-05-19 NOTE — Discharge Instructions (Addendum)
Get plenty of rest and push fluids Tessalon Perles prescribed for cough Bactrim for sinus infection Use OTC zyrtec for nasal congestion, runny nose, and/or sore throat Use OTC flonase for nasal congestion and runny nose Use medications daily for symptom relief Use OTC medications like ibuprofen or tylenol as needed fever or pain Call or go to the ED if you have any new or worsening symptoms such as fever, worsening cough, shortness of breath, chest tightness, chest pain, turning blue, changes in mental status, etc..Marland Kitchen

## 2021-05-19 NOTE — ED Triage Notes (Signed)
Monday night pt started having scratchy throat, fever , chest and sinus congestion started on Tuesday.  Fever and throat issues have gone away but is still having sinus congestion and cough.

## 2021-05-19 NOTE — Telephone Encounter (Signed)
Pt called for appt. Having scratchy throat, cough, congestion, fever ( highest 100) no sob. Started 5 days ago Took covid test 3 days ago that was negative. No available appts advised to go to urgent care. Was going to give phone number but pt states he already has and will give them a call today.

## 2021-12-06 ENCOUNTER — Other Ambulatory Visit: Payer: Self-pay

## 2021-12-06 ENCOUNTER — Ambulatory Visit
Admission: RE | Admit: 2021-12-06 | Discharge: 2021-12-06 | Disposition: A | Discharge status: Home / Self Care | Payer: Managed Care, Other (non HMO) | Source: Ambulatory Visit | Attending: Urgent Care | Admitting: Urgent Care

## 2021-12-06 ENCOUNTER — Ambulatory Visit (INDEPENDENT_AMBULATORY_CARE_PROVIDER_SITE_OTHER): Payer: Managed Care, Other (non HMO)

## 2021-12-06 VITALS — BP 116/68 | Temp 98.6°F | Resp 18 | Ht 73.0 in | Wt 150.0 lb

## 2021-12-06 DIAGNOSIS — Z8709 Personal history of other diseases of the respiratory system: Secondary | ICD-10-CM

## 2021-12-06 DIAGNOSIS — J4520 Mild intermittent asthma, uncomplicated: Secondary | ICD-10-CM

## 2021-12-06 DIAGNOSIS — J3081 Allergic rhinitis due to animal (cat) (dog) hair and dander: Secondary | ICD-10-CM | POA: Diagnosis not present

## 2021-12-06 DIAGNOSIS — R0789 Other chest pain: Secondary | ICD-10-CM | POA: Diagnosis not present

## 2021-12-06 DIAGNOSIS — J3089 Other allergic rhinitis: Secondary | ICD-10-CM

## 2021-12-06 DIAGNOSIS — R0602 Shortness of breath: Secondary | ICD-10-CM

## 2021-12-06 MED ORDER — FLUTICASONE PROPIONATE 50 MCG/ACT NA SUSP: 2.0000 | Freq: Every day | NASAL | 12 refills | Status: DC

## 2021-12-06 MED ORDER — PREDNISONE 10 MG PO TABS: 30.0000 mg | ORAL_TABLET | Freq: Every day | ORAL | 0 refills | Status: DC

## 2021-12-06 MED ORDER — MONTELUKAST SODIUM 10 MG PO TABS: 10.0000 mg | ORAL_TABLET | Freq: Every day | ORAL | 0 refills | Status: DC

## 2021-12-06 MED ORDER — LEVOCETIRIZINE DIHYDROCHLORIDE 5 MG PO TABS: 5.0000 mg | ORAL_TABLET | Freq: Every evening | ORAL | 0 refills | Status: DC

## 2021-12-06 NOTE — ED Triage Notes (Addendum)
Pt reports while at work x3 days ago had sudden onset back tightness and intermittent dyspnea that lasted for a few minutes.pt reports similar episodes last several days and reports got home from work yesterday and noticed facial swelling/throat swelling/gum tingling, took otc benadryl and allergy medicine, went to sleep and symptoms subsided. Pt reports today stiffness returned while at work and reports also has neck tightness today. 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 Pt reports owns cat for last several months. Denise any new self-care products. Airway patent. Nad noted at this time.

## 2021-12-06 NOTE — ED Provider Notes (Signed)
[facility]   MRN: [id number] DOB: 09-Sep-2001  Subjective:   Mark Irwin is a 21 y.o. male presenting for 3-day history of acute onset persistent intermittent chest tightness, shortness of breath.  Episodes last a few minutes and resolved without intervention but he is becoming concerned that its not resolving.  He did have some facial swelling and throat swelling, tingling and scratchiness of his throat yesterday.  Has been using Benadryl with very temporary relief.  Of note, he does have a history of reactive airway disease, asthma.  This was more prominent in his childhood and with exercise.  He has a history of significant allergies to cats but that he grew out of it.  He did get a new cat in the past several months and has not been taking any allergy medications or being judicious about areas that the cat is around in his home.  He is not a smoker.  No current facility-administered medications for this encounter.  Current Outpatient Medications:    albuterol (VENTOLIN HFA) 108 (90 Base) MCG/ACT inhaler, Inhale 2 puffs into the lungs every 6 (six) hours as needed (exercising)., Disp: 8 g, Rfl: 0   Allergies  Allergen Reactions   Bee Venom Anaphylaxis   Wasp Venom Anaphylaxis   Doxycycline     Broke out with rash as well as hives after taking doxycycline for 1 day   Amoxil [Amoxicillin] Hives    Has patient had a PCN reaction causing immediate rash, facial/tongue/throat swelling, SOB or lightheadedness with hypotension: Yes Has patient had a PCN reaction causing severe rash involving mucus membranes or skin necrosis: No Has patient had a PCN reaction that required hospitalization: No Has patient had a PCN reaction occurring within the last 10 years: Yes If all of the above answers are "NO", then may proceed with Cephalosporin use.    Cats Claw [Uncaria Tomentosa (Cats Claw)] Itching   Ibuprofen Other (See Comments)    Finger tips swell     Past Medical  History:  Diagnosis Date   Asthma    exercise induced    Renal disorder    kidney stone     Past Surgical History:  Procedure Laterality Date   WISDOM TOOTH EXTRACTION      History reviewed. No pertinent family history.  Social History   Tobacco Use   Smoking status: Never   Smokeless tobacco: Never  Substance Use Topics   Alcohol use: Never   Drug use: Never    ROS   Objective:   Vitals: BP 116/68 (BP Location: Right Arm)    Temp 98.6 F (37 C) (Oral)    Resp 18    Ht 6\' 1"  (1.854 m)    Wt 150 lb (68 kg)    SpO2 97%    BMI 19.79 kg/m   Physical Exam Constitutional:      General: He is not in acute distress.    Appearance: Normal appearance. He is well-developed and normal weight. He is not ill-appearing, toxic-appearing or diaphoretic.  HENT:     Head: Normocephalic and atraumatic.     Right Ear: Tympanic membrane, ear canal and external ear normal. There is no impacted cerumen.     Left Ear: Tympanic membrane, ear canal and external ear normal. There is no impacted cerumen.     Nose: Congestion present. No rhinorrhea.     Mouth/Throat:     Mouth: Mucous membranes are moist.     Pharynx: No oropharyngeal exudate or posterior oropharyngeal  erythema.     Comments: Significant postnasal drainage overlying pharynx. Eyes:     General: No scleral icterus.       Right eye: No discharge.        Left eye: No discharge.     Extraocular Movements: Extraocular movements intact.     Conjunctiva/sclera: Conjunctivae normal.  Cardiovascular:     Rate and Rhythm: Normal rate and regular rhythm.     Heart sounds: Normal heart sounds. No murmur heard.   No friction rub. No gallop.  Pulmonary:     Effort: Pulmonary effort is normal. No respiratory distress.     Breath sounds: No stridor. No wheezing, rhonchi or rales.     Comments: Slightly decreased lung sounds and mid to bibasilar fields. Musculoskeletal:     Cervical back: Normal range of motion and neck supple. No  rigidity. No muscular tenderness.  Neurological:     General: No focal deficit present.     Mental Status: He is alert and oriented to person, place, and time.  Psychiatric:        Mood and Affect: Mood normal.        Behavior: Behavior normal.        Thought Content: Thought content normal.    DG Chest 2 View  Result Date: 12/06/2021 CLINICAL DATA:  Chest tightness and shortness of breath EXAM: CHEST - 2 VIEW COMPARISON:  06/11/2020 FINDINGS: Mild hyperinflation, felt to be within normal variation for age. Midline trachea. Normal heart size and mediastinal contours. No pleural effusion or pneumothorax. Clear lungs. IMPRESSION: No acute cardiopulmonary disease. Electronically Signed   By: Jeronimo Greaves M.D.   On: 12/06/2021 15:51     Assessment and Plan :   PDMP not reviewed this encounter.  1. Chest tightness   2. Mild intermittent reactive airway disease without complication   3. History of asthma   4. Allergy to cats   5. Allergic rhinitis due to other allergic trigger, unspecified seasonality    Recommended an oral prednisone course as I suspect his primary symptoms are reactive, inflammatory related to his cat allergies.  Recommended he start Flonase, Xyzal and Singulair daily as he plans on keeping the cath and has a very difficult time with his allergic rhinitis.  Declined respiratory testing. Counseled patient on potential for adverse effects with medications prescribed/recommended today, ER and return-to-clinic precautions discussed, patient verbalized understanding.    Wallis Bamberg, PA-C 12/06/21 1620

## 2023-03-05 ENCOUNTER — Ambulatory Visit
Admission: EM | Admit: 2023-03-05 | Discharge: 2023-03-05 | Disposition: A | Discharge status: Home / Self Care | Payer: Managed Care, Other (non HMO) | Attending: Nurse Practitioner | Admitting: Nurse Practitioner

## 2023-03-05 DIAGNOSIS — T7840XA Allergy, unspecified, initial encounter: Secondary | ICD-10-CM

## 2023-03-05 MED ORDER — CETIRIZINE HCL 10 MG PO CAPS: 10.0000 mg | ORAL_CAPSULE | Freq: Every day | ORAL | 0 refills | Status: DC

## 2023-03-05 MED ORDER — DEXAMETHASONE SODIUM PHOSPHATE 10 MG/ML IJ SOLN
10.0000 mg | Freq: Once | INTRAMUSCULAR | Status: AC
  Administered 2023-03-05: 10 mg via INTRAMUSCULAR

## 2023-03-05 MED ORDER — FAMOTIDINE 20 MG PO TABS: 20.0000 mg | ORAL_TABLET | Freq: Two times a day (BID) | ORAL | 0 refills | Status: DC

## 2023-03-05 NOTE — ED Triage Notes (Addendum)
Pt c/o allergic reaction x 2 days, pt is unaware of what caused reaction, pt is having swelling in lips tongues and ears, swelling numbness in left hand, S.O.B, burning in chest.   Pt hs taken Benadryl, helps only slightly. Does not completely alleviate sx's

## 2023-03-05 NOTE — Discharge Instructions (Signed)
We have given you a shot of steroid today to help with inflammation at the cellular level  Please continue the oral antihistamine twice daily as needed for itching or burning.  Also start Pepcid twice daily to help with the histamine response.  Recommend following up with an allergist for further evaluation and management since this has occurred more than one time and we do not know what is causing it.

## 2023-03-05 NOTE — ED Provider Notes (Signed)
[facility]    CSN: [id number] Arrival date & time: 03/05/23  1742      History   Chief Complaint No chief complaint on file.   HPI Mark Irwin is a 22 y.o. male.   Patient presents today with 2-day history of allergic reaction to his lips, face, ears.  Reports he has been having occasional trouble breathing and burning in his chest.  Patient denies rash or itching.  Reports he has been taking Benadryl which seems to help with symptoms temporarily.  Patient denies recent new medications or supplements, no new detergents or soaps.  Patient denies history of food allergies and has not eaten anything out of the ordinary for him.  Reports he has a history of similar allergic reactions and nobody is ever able to tell him what the cause is.    Past Medical History:  Diagnosis Date   Asthma    exercise induced    Renal disorder    kidney stone    Patient Active Problem List   Diagnosis Date Noted   Kidney stone 10/31/2016    Past Surgical History:  Procedure Laterality Date   WISDOM TOOTH EXTRACTION         Home Medications    Prior to Admission medications   Medication Sig Start Date End Date Taking? Authorizing Provider  Cetirizine HCl 10 MG CAPS Take 1 capsule (10 mg total) by mouth daily. 03/05/23  Yes Valentino Nose, NP  famotidine (PEPCID) 20 MG tablet Take 1 tablet (20 mg total) by mouth 2 (two) times daily. 03/05/23  Yes Valentino Nose, NP  albuterol (VENTOLIN HFA) 108 (90 Base) MCG/ACT inhaler Inhale 2 puffs into the lungs every 6 (six) hours as needed (exercising). 05/02/21   Ladona Ridgel, Malena M, DO  fluticasone (FLONASE) 50 MCG/ACT nasal spray Place 2 sprays into both nostrils daily. 12/06/21   Wallis Bamberg, PA-C  montelukast (SINGULAIR) 10 MG tablet Take 1 tablet (10 mg total) by mouth at bedtime. 12/06/21   Wallis Bamberg, PA-C  loratadine (CLARITIN) 10 MG tablet Take 10 mg by mouth daily. Patient not taking: Reported on 06/27/2020  08/22/20  [provider]    Family History History reviewed. No pertinent family history.  Social History Social History   Tobacco Use   Smoking status: Never   Smokeless tobacco: Never  Substance Use Topics   Alcohol use: Never   Drug use: Never     Allergies   Bee venom, Wasp venom, Doxycycline, Amoxil [amoxicillin], Cats claw [uncaria tomentosa (cats claw)], and Ibuprofen   Review of Systems Review of Systems Per HPI  Physical Exam Triage Vital Signs ED Triage Vitals [03/05/23 1825]  Enc Vitals Group     BP 106/66     Pulse Rate 79     Resp 16     Temp 98.5 F (36.9 C)     Temp Source Oral     SpO2 98 %     Weight      Height      Head Circumference      Peak Flow      Pain Score 0     Pain Loc      Pain Edu?      Excl. in GC?    No data found.  Updated Vital Signs BP 106/66 (BP Location: Right Arm)   Pulse 79   Temp 98.5 F (36.9 C) (Oral)   Resp 16   SpO2 98%   Visual  Acuity Right Eye Distance:   Left Eye Distance:   Bilateral Distance:    Right Eye Near:   Left Eye Near:    Bilateral Near:     Physical Exam Vitals and nursing note reviewed.  Constitutional:      General: He is not in acute distress.    Appearance: Normal appearance. He is not ill-appearing or toxic-appearing.  HENT:     Head: Normocephalic and atraumatic.     Right Ear: Tympanic membrane, ear canal and external ear normal.     Left Ear: Tympanic membrane, ear canal and external ear normal.     Nose: No congestion or rhinorrhea.     Mouth/Throat:     Mouth: Mucous membranes are moist.     Pharynx: Oropharynx is clear. No oropharyngeal exudate or posterior oropharyngeal erythema.     Comments: Oral airway is patent Eyes:     General: No scleral icterus.    Extraocular Movements: Extraocular movements intact.  Cardiovascular:     Rate and Rhythm: Normal rate and regular rhythm.  Pulmonary:     Effort: Pulmonary effort is normal. No respiratory distress.     Breath sounds:  Normal breath sounds. No wheezing, rhonchi or rales.     Comments: Patient talking in complete sentences without accessory muscle use Musculoskeletal:     Cervical back: Normal range of motion and neck supple.  Lymphadenopathy:     Cervical: No cervical adenopathy.  Skin:    General: Skin is warm and dry.     Capillary Refill: Capillary refill takes less than 2 seconds.     Coloration: Skin is not jaundiced or pale.     Findings: No erythema or rash.  Neurological:     Mental Status: He is alert and oriented to person, place, and time.  Psychiatric:        Behavior: Behavior is cooperative.      UC Treatments / Results  Labs (all labs ordered are listed, but only abnormal results are displayed) Labs Reviewed - No data to display  EKG   Radiology No results found.  Procedures Procedures (including critical care time)  Medications Ordered in UC Medications  dexamethasone (DECADRON) injection 10 mg (10 mg Intramuscular Given 03/05/23 1858)    Initial Impression / Assessment and Plan / UC Course  I have reviewed the triage vital signs and the nursing notes.  Pertinent labs & imaging results that were available during my care of the patient were reviewed by me and considered in my medical decision making (see chart for details).   Patient is well-appearing, normotensive, afebrile, not tachycardic, not tachypneic, oxygenating well on room air.    1. Allergic reaction, initial encounter Vital signs and examination today are reassuring Recommended use of twice daily oral antihistamine, Pepcid twice daily as well Decadron 10 mg IM given today in urgent care with inflammation at the cellular level Strict ER precautions discussed with patient and recommended follow-up with allergist for further evaluation and allergy testing  The patient was given the opportunity to ask questions.  All questions answered to their satisfaction.  The patient is in agreement to this plan.    Final  Clinical Impressions(s) / UC Diagnoses   Final diagnoses:  Allergic reaction, initial encounter     Discharge Instructions      We have given you a shot of steroid today to help with inflammation at the cellular level  Please continue the oral antihistamine twice daily as needed for itching  or burning.  Also start Pepcid twice daily to help with the histamine response.  Recommend following up with an allergist for further evaluation and management since this has occurred more than one time and we do not know what is causing it.     ED Prescriptions     Medication Sig Dispense Auth. Provider   famotidine (PEPCID) 20 MG tablet Take 1 tablet (20 mg total) by mouth 2 (two) times daily. 30 tablet Cathlean MarseillesMartinez, Sarina Robleto A, NP   Cetirizine HCl 10 MG CAPS Take 1 capsule (10 mg total) by mouth daily. 30 capsule Valentino NoseMartinez, Geral Coker A, NP      PDMP not reviewed this encounter.   Valentino NoseMartinez, Ambyr Qadri A, NP 03/05/23 1901

## 2024-04-04 ENCOUNTER — Other Ambulatory Visit: Payer: Self-pay

## 2024-04-04 ENCOUNTER — Ambulatory Visit (INDEPENDENT_AMBULATORY_CARE_PROVIDER_SITE_OTHER)

## 2024-04-04 ENCOUNTER — Ambulatory Visit
Admission: EM | Admit: 2024-04-04 | Discharge: 2024-04-04 | Disposition: A | Discharge status: Home / Self Care | Attending: Nurse Practitioner | Admitting: Nurse Practitioner

## 2024-04-04 DIAGNOSIS — K5900 Constipation, unspecified: Secondary | ICD-10-CM

## 2024-04-04 DIAGNOSIS — R102 Pelvic and perineal pain: Secondary | ICD-10-CM

## 2024-04-04 DIAGNOSIS — R1030 Lower abdominal pain, unspecified: Secondary | ICD-10-CM

## 2024-04-04 DIAGNOSIS — N200 Calculus of kidney: Secondary | ICD-10-CM

## 2024-04-04 LAB — POCT URINALYSIS DIP (MANUAL ENTRY)
Bilirubin, UA: NEGATIVE
Blood, UA: NEGATIVE
Glucose, UA: NEGATIVE mg/dL
Ketones, POC UA: NEGATIVE mg/dL
Leukocytes, UA: NEGATIVE
Nitrite, UA: NEGATIVE
Protein Ur, POC: NEGATIVE mg/dL
Spec Grav, UA: >=1.03 — AB (ref 1.010–1.025)
Urobilinogen, UA: 0.2 U/dL
pH, UA: 5.5 (ref 5.0–8.0)

## 2024-04-04 MED ORDER — NAPROXEN 500 MG PO TABS: 500.0000 mg | ORAL_TABLET | Freq: Two times a day (BID) | ORAL | 0 refills | Status: DC

## 2024-04-04 MED ORDER — KETOROLAC TROMETHAMINE 30 MG/ML IJ SOLN
30.0000 mg | Freq: Once | INTRAMUSCULAR | Status: AC
  Administered 2024-04-04: 30 mg via INTRAMUSCULAR

## 2024-04-04 MED ORDER — SENNOSIDES-DOCUSATE SODIUM 8.6-50 MG PO TABS: 1.0000 | ORAL_TABLET | Freq: Every day | ORAL | 0 refills | Status: DC

## 2024-04-04 MED ORDER — TAMSULOSIN HCL 0.4 MG PO CAPS
0.4000 mg | ORAL_CAPSULE | Freq: Every day | ORAL | 0 refills | Status: AC
Start: 2024-04-04 — End: ?

## 2024-04-04 NOTE — ED Provider Notes (Addendum)
 [facility]    CSN: [id number] Arrival date & time: 04/04/24  4098      History   Chief Complaint No chief complaint on file.   HPI Mark Irwin is a 23 y.o. male.   The history is provided by the patient.   Patient presents with a 5 to 6-day history of lower abdominal pain and trouble urinating.  Patient states that the abdominal pain wraps around his back and down into his pelvis.  He states that he had worsening trouble urinating this morning, states that he had to strain.  States that he did pass out after the episode.  States that he believes he hit the side of his head.  States that he has not had fever, chills, chest pain, nausea, vomiting, diarrhea, or rash.  Patient further denies dysuria, hematuria, urinary frequency, urgency, dizziness, headache, blurred vision, light sensitivity, or sound sensitivity..  Patient reports prior history of kidney stone approximately 4 years ago.  Past Medical History:  Diagnosis Date   Asthma    exercise induced    Renal disorder    kidney stone    Patient Active Problem List   Diagnosis Date Noted   Kidney stone 10/31/2016    Past Surgical History:  Procedure Laterality Date   WISDOM TOOTH EXTRACTION         Home Medications    Prior to Admission medications   Medication Sig Start Date End Date Taking? Authorizing Provider  senna-docusate (SENOKOT-S) 8.6-50 MG tablet Take 1 tablet by mouth daily. 04/04/24  Yes Leath-Warren, Belen Bowers, NP  tamsulosin (FLOMAX) 0.4 MG CAPS capsule Take 1 capsule (0.4 mg total) by mouth at bedtime. 04/04/24  Yes Leath-Warren, Belen Bowers, NP  albuterol  (VENTOLIN  HFA) 108 (90 Base) MCG/ACT inhaler Inhale 2 puffs into the lungs every 6 (six) hours as needed (exercising). 05/02/21   Carolynne Citron, Malena M, DO  Cetirizine  HCl 10 MG CAPS Take 1 capsule (10 mg total) by mouth daily. 03/05/23   Wilhemena Harbour, NP  famotidine  (PEPCID ) 20 MG tablet Take 1 tablet (20 mg total) by mouth 2 (two)  times daily. 03/05/23   Wilhemena Harbour, NP  fluticasone  (FLONASE ) 50 MCG/ACT nasal spray Place 2 sprays into both nostrils daily. 12/06/21   Adolph Hoop, PA-C  montelukast  (SINGULAIR ) 10 MG tablet Take 1 tablet (10 mg total) by mouth at bedtime. 12/06/21   Adolph Hoop, PA-C  loratadine (CLARITIN) 10 MG tablet Take 10 mg by mouth daily. Patient not taking: Reported on 06/27/2020  08/22/20  [provider]    Family History History reviewed. No pertinent family history.  Social History Social History   Tobacco Use   Smoking status: Never   Smokeless tobacco: Never  Substance Use Topics   Alcohol use: Never   Drug use: Never     Allergies   Bee venom, Wasp venom, Doxycycline , Amoxil [amoxicillin], Cats claw [uncaria tomentosa (cats claw)], and Ibuprofen    Review of Systems Review of Systems Per HPI  Physical Exam Triage Vital Signs ED Triage Vitals  Encounter Vitals Group     BP 04/04/24 0835 119/80     Systolic BP Percentile --      Diastolic BP Percentile --      Pulse Rate 04/04/24 0835 70     Resp 04/04/24 0835 18     Temp 04/04/24 0835 (!) 97.5 F (36.4 C)     Temp Source 04/04/24 0835 Oral     SpO2 04/04/24 0835 98 %  Weight --      Height --      Head Circumference --      Peak Flow --      Pain Score 04/04/24 0839 5     Pain Loc --      Pain Education --      Exclude from Growth Chart --    No data found.  Updated Vital Signs BP 119/80 (BP Location: Right Arm)   Pulse 70   Temp (!) 97.5 F (36.4 C) (Oral)   Resp 18   SpO2 98%   Visual Acuity Right Eye Distance:   Left Eye Distance:   Bilateral Distance:    Right Eye Near:   Left Eye Near:    Bilateral Near:     Physical Exam Vitals and nursing note reviewed.  Constitutional:      General: He is not in acute distress.    Appearance: Normal appearance.  HENT:     Head: Normocephalic.  Eyes:     Extraocular Movements: Extraocular movements intact.     Pupils: Pupils are  equal, round, and reactive to light.  Cardiovascular:     Rate and Rhythm: Normal rate and regular rhythm.     Pulses: Normal pulses.     Heart sounds: Normal heart sounds.  Pulmonary:     Effort: Pulmonary effort is normal. No respiratory distress.     Breath sounds: Normal breath sounds. No stridor. No wheezing, rhonchi or rales.  Chest:     Chest wall: No tenderness.  Abdominal:     General: Abdomen is flat. Bowel sounds are normal.     Palpations: Abdomen is soft.     Tenderness: There is abdominal tenderness in the suprapubic area. There is no right CVA tenderness or left CVA tenderness.  Musculoskeletal:     Cervical back: Normal range of motion.  Lymphadenopathy:     Cervical: No cervical adenopathy.  Skin:    General: Skin is warm and dry.  Neurological:     General: No focal deficit present.     Mental Status: He is alert and oriented to person, place, and time.  Psychiatric:        Mood and Affect: Mood normal.        Behavior: Behavior normal.      UC Treatments / Results  Labs (all labs ordered are listed, but only abnormal results are displayed) Labs Reviewed  POCT URINALYSIS DIP (MANUAL ENTRY) - Abnormal; Notable for the following components:      Result Value   Spec Grav, UA >=1.030 (*)    All other components within normal limits    EKG: Normal sinus rhythm with arrhythmia, no STEMI.  No other comparisons available.   Radiology DG Abd 1 View Result Date: 04/04/2024 CLINICAL DATA:  History of kidney stones.  Lower abdominal pain. EXAM: ABDOMEN - 1 VIEW COMPARISON:  CT AP 07/21/2017 FINDINGS: Lower pole right kidney stone is identified measuring 4 mm. The bowel gas pattern appears nonobstructed. No dilated loops of large or small bowel. Gas and stool noted within the colon up to the rectum. Visualized osseous structures are unremarkable. IMPRESSION: 1. Lower pole right kidney stone measuring 4 mm. 2. Nonobstructive bowel gas pattern. Electronically Signed    By: Kimberley Penman M.D.   On: 04/04/2024 10:13    Procedures Procedures (including critical care time)  Medications Ordered in UC Medications  ketorolac  (TORADOL ) 30 MG/ML injection 30 mg (30 mg Intramuscular Given 04/04/24 0909)  Initial Impression / Assessment and Plan / UC Course  I have reviewed the triage vital signs and the nursing notes.  Pertinent labs & imaging results that were available during my care of the patient were reviewed by me and considered in my medical decision making (see chart for details).  Patient presents with lower abdominal pain that radiates to his pelvic region and a syncopal episode that occurred this morning after he was having difficulty urinating.  On exam, lung sounds are clear throughout, room air sats at 90%, vital signs are stable.  His neurological exam is within normal limits.  EKG was performed which showed normal sinus rhythm.  With regard to his urinary symptoms, urinalysis only showed elevated specific gravity, no other abnormalities.  Patient with underlying history of kidney stones, awaiting results the abdominal x-ray.  Prelim read shows constipation.  Will treat patient empirically with tamsulosin 0.4 mg to help with urinary flow and Senokot 8.6/50 mg for constipation.  Supportive care recommendations were provided and discussed with the patient to include fluids, rest, and over-the-counter analgesics.  Discussed strict ER follow-up precautions with the patient.  Patient was in agreement with this plan of care and verbalized understanding.  All questions were answered.  Patient stable for discharge.  Work note was provided.  Update 1112am: Reviewed patient's x-ray results.  Call patient to discuss results, verified patient with 2 patient identifiers.  Patient was advised that x-ray does show a kidney stone.  Referral placed for patient to follow-up with urology, information was provided.  Patient also prescribed naproxen 500 mg for pain, continue  with prescription of tamsulosin as directed.  Reiterated the importance of increasing his fluid intake.  Patient was in agreement with this plan of care and verbalized understanding.  All questions were answered.    Final Clinical Impressions(s) / UC Diagnoses   Final diagnoses:  Pelvic pain  Lower abdominal pain  Constipation, unspecified constipation type     Discharge Instructions      You will be contacted and results of the x-ray are received. Take medication as prescribed. Increase fluids and allow for plenty of rest.  Make sure you are drinking at least 8-10 8 ounce glasses of water  daily while symptoms persist. You may take over-the-counter Tylenol  as needed for pain, fever, or general discomfort. Develop a toileting schedule that will allow you to urinate at least every 2 hours. Avoid caffeine while symptoms persist to include tea, coffee, or soda. Follow-up in the emergency department if you experience worsening abdominal pain, become unable to urinate, or feel like you may pass out. Recommend following up with your primary care physician within the next 7 to 10 days for reevaluation. Follow-up as needed.   ED Prescriptions     Medication Sig Dispense Auth. Provider   tamsulosin (FLOMAX) 0.4 MG CAPS capsule Take 1 capsule (0.4 mg total) by mouth at bedtime. 30 capsule Leath-Warren, Belen Bowers, NP   senna-docusate (SENOKOT-S) 8.6-50 MG tablet Take 1 tablet by mouth daily. 60 tablet Leath-Warren, Belen Bowers, NP      PDMP not reviewed this encounter.   Hardy Lia, NP 04/04/24 1019    Leath-Warren, Belen Bowers, NP 04/04/24 1114

## 2024-04-04 NOTE — ED Triage Notes (Signed)
 Pt reports he has lower abdominal pain that wraps around to his back that radiates down to his pelvis, fever, and  trouble urinating x 5-6 days worsened this morning. Pt passed out this morning in the bathroom possible his head. States his head feels tinder. Was straining to urine when he passed out

## 2024-04-04 NOTE — Discharge Instructions (Addendum)
 You will be contacted and results of the x-ray are received. Take medication as prescribed. Increase fluids and allow for plenty of rest.  Make sure you are drinking at least 8-10 8 ounce glasses of water  daily while symptoms persist. You may take over-the-counter Tylenol  as needed for pain, fever, or general discomfort. Develop a toileting schedule that will allow you to urinate at least every 2 hours. Avoid caffeine while symptoms persist to include tea, coffee, or soda. Follow-up in the emergency department if you experience worsening abdominal pain, become unable to urinate, or feel like you may pass out. Recommend following up with your primary care physician within the next 7 to 10 days for reevaluation. Follow-up as needed.

## 2024-04-10 NOTE — Progress Notes (Signed)
 Name: Mark Irwin DOB: [birth year] MRN: [id number]  History of Present Illness: Mark Irwin is a 23 y.o. male who presents today as a new patient at [facility] [place]. All available relevant medical records have been reviewed.  GU History includes: 1. Kidney stones. - Passed stones spontaneously; denies prior history of kidney stone procedure. 2. Bilateral varicoceles and hydroceles.  - Per scrotal / testicular ultrasound on 06/11/2020.  He reports concern of kidney stone(s).   Recent history: > 04/04/2024: Seen at urgent care for lower abdominal pain and trouble urinating x5-6 days. "He states that he had worsening trouble urinating this morning, states that he had to strain.  States that he did pass out after the episode.  States that he believes he hit the side of his head." UA negative. KUB showed a 4 mm right kidney lower pole stone. - Prescriptions given for Flomax  for MET, Naproxen  500 mg for pain, and Senokot for constipation.   Today: Reports that for the past 1-2 weeks he has had bilateral lower abdominal "tightness" and pain which is intermittent; left > right. States that pain was severe 1-2 days ago. Denies flank pain, fevers, nausea, or vomiting.  Reports that when the pain started he was having constipation, which he states has since improved. Reports having bowel movements daily at this point. Denies taking stool softener / laxative.   Reports intermittent straining to urinate. Denies increased urinary urgency, frequency, nocturia, dysuria, gross hematuria, hesitancy, or sensations of incomplete emptying. He denies recent stone passage.    Medications: Current Outpatient Medications  Medication Sig Dispense Refill   albuterol  (VENTOLIN  HFA) 108 (90 Base) MCG/ACT inhaler Inhale 2 puffs into the lungs every 6 (six) hours as needed (exercising). 8 g 0   naproxen  (NAPROSYN ) 500 MG tablet Take 1 tablet (500 mg total) by mouth 2 (two) times daily. 30 tablet 0    senna-docusate (SENOKOT-S) 8.6-50 MG tablet Take 1 tablet by mouth daily. 60 tablet 0   tamsulosin  (FLOMAX ) 0.4 MG CAPS capsule Take 1 capsule (0.4 mg total) by mouth at bedtime. 30 capsule 0   No current facility-administered medications for this visit.    Allergies: Allergies  Allergen Reactions   Bee Venom Anaphylaxis   Wasp Venom Anaphylaxis   Doxycycline      Broke out with rash as well as hives after taking doxycycline  for 1 day   Amoxil [Amoxicillin] Hives    Has patient had a PCN reaction causing immediate rash, facial/tongue/throat swelling, SOB or lightheadedness with hypotension: Yes Has patient had a PCN reaction causing severe rash involving mucus membranes or skin necrosis: No Has patient had a PCN reaction that required hospitalization: No Has patient had a PCN reaction occurring within the last 10 years: Yes If all of the above answers are "NO", then may proceed with Cephalosporin use.    Cats Claw [Uncaria Tomentosa (Cats Claw)] Itching   Ibuprofen  Other (See Comments)    Finger tips swell     Past Medical History:  Diagnosis Date   Asthma    exercise induced    Renal disorder    kidney stone   Past Surgical History:  Procedure Laterality Date   WISDOM TOOTH EXTRACTION     History reviewed. No pertinent family history. Social History   Socioeconomic History   Marital status: Single    Spouse name: Not on file   Number of children: Not on file   Years of education: Not on file   Highest education  level: Not on file  Occupational History   Not on file  Tobacco Use   Smoking status: Never   Smokeless tobacco: Never  Substance and Sexual Activity   Alcohol use: Never   Drug use: Never   Sexual activity: Not on file  Other Topics Concern   Not on file  Social History Narrative   ** Merged History Encounter **       Social Drivers of Health   Financial Resource Strain: Not on file  Food Insecurity: Not on file  Transportation Needs: Not on  file  Physical Activity: Not on file  Stress: Not on file  Social Connections: Not on file  Intimate Partner Violence: Not on file    SUBJECTIVE  Review of Systems Constitutional: Patient denies any unintentional weight loss or change in strength lntegumentary: Patient denies any rashes or pruritus Cardiovascular: Patient denies chest pain or syncope Respiratory: Patient denies shortness of breath Gastrointestinal: As per HPI Musculoskeletal: Patient denies muscle cramps or weakness Neurologic: Patient denies convulsions or seizures Allergic/Immunologic: Patient denies recent allergic reaction(s) Hematologic/Lymphatic: Patient denies bleeding tendencies Endocrine: Patient denies heat/cold intolerance  GU: As per HPI.  OBJECTIVE Vitals:   04/13/24 1002  BP: 123/71  Pulse: 88   There is no height or weight on file to calculate BMI.  Physical Examination Constitutional: No obvious distress; patient is non-toxic appearing  Cardiovascular: No visible lower extremity edema.  Respiratory: The patient does not have audible wheezing/stridor; respirations do not appear labored  Gastrointestinal: Abdomen non-distended Musculoskeletal: Normal ROM of UEs  Skin: No obvious rashes/open sores  Neurologic: CN 2-12 grossly intact Psychiatric: Answered questions appropriately with normal affect  Hematologic/Lymphatic/Immunologic: No obvious bruises or sites of spontaneous bleeding  UA: negative   ASSESSMENT Kidney stone - Plan: Urinalysis, Routine w reflex microscopic, CT RENAL STONE STUDY, DG Abd 1 View  Lower abdominal pain - Plan: CT RENAL STONE STUDY  Constipation, unspecified constipation type - Plan: CT RENAL STONE STUDY  We reviewed recent history and KUB images. For further evaluation due to persistent bilateral lower abdominal pain we agreed to proceed with CT stone study. Will plan to notify patient of recommendations following review of imaging results.  He was advised to  continue Flomax  0.4 mg daily for medical expulsive therapy (MET), which may improve passage of stone(s).   Advised adequate hydration and we discussed option to consider low oxalate diet given that calcium oxalate is the most common type of stone. Handout provided about stone prevention diet.  We discussed how his constipation likely contributed to voiding dysfunction. Advised use of stool softener and/or laxative PRN for constipation.   Will plan to follow up in 6 months with KUB for routine stone surveillance or sooner if needed.   He was advised to contact urology provider or go to the ER if He develops fever >101F, uncontrollable pain, or other significantly concerning symptoms prior to next office visit.  He verbalized understanding and agreement. All questions were answered.   PLAN Advised the following: CT stone.  Flomax  0.4 mg daily for MET.   OTC analgesics PRN for pain. Stool softener and/or laxative PRN for constipation.  Return in about 6 months (around 10/14/2024) for KUB, UA, & f/u with Griselda Lederer NP.  Orders Placed This Encounter  Procedures   CT RENAL STONE STUDY    Standing Status:   Future    Expiration Date:   04/13/2025    Preferred imaging location?:   [facility]  DG Abd 1 View    Standing Status:   Future    Expected Date:   10/14/2024    Expiration Date:   04/13/2025    Reason for Exam (SYMPTOM  OR DIAGNOSIS REQUIRED):   kidney stone    Preferred imaging location?:   [facility]   Urinalysis, Routine w reflex microscopic    It has been explained that the patient is to follow regularly with their PCP in addition to all other providers involved in their care and to follow instructions provided by these respective offices. Patient advised to contact urology clinic if any urologic-pertaining questions, concerns, new symptoms or problems arise in the interim period.  Patient Instructions  >80% of stones are calcium oxalate. This type of  stones forms when body either isn't clearing oxalate well enough, is making too much oxalate, or too little citrate. This results in oxalate binding to form crystals, which continue to aggregate and form stones.  Limiting calcium does not help, but limiting oxalate in the diet can help. Increasing citric acid intake may also help.  The following measures may help to prevent the recurrence of stones: Increase water  intake to 2-2.5 liters per day May add citrus juice (lemon, lime or orange juice) to water  Moderation in dairy foods Decrease in salt content 5. Low Oxalate diet: Oxylates are found in foods like Tomato, Spinach, red wine and chocolate (see additional resources below).  Internet resources for information regarding low oxalate diet: https://kidneystones.yangchunwu.com https://my.VerticalStretch.be  Foods Low in Sodium or Oxalate Foods You Can Eat  Drinks Coffee, fruit and veggie juice (using the recommended veggies), fruit punch  Fruits Apples, apricots (fresh or canned), avocado, bananas, cherries (sweet), cranberries, grapefruit, red or green grapes, lemon and lime juice, melons, nectarines, papayas, peaches, pears, pineapples, oranges, strawberries (fresh), tangerines  Veggies Artichokes, asparagus, bamboo shoots, broccoli, brussels sprouts, cabbage, cauliflower, chayote squash, chicory, corn, cucumbers, endive, lettuce, lima beans, mushrooms, onions, peas, peppers, potatoes, radishes, rutabagas, zucchini  Breads, Cereals, Grains Egg noodles, rye bread, cooked and dry cereals without nuts or bran, crackers with unsalted tops, white or wild rice  Meat, Meat Replacements, Fish, Recruitment consultant, fish, poultry, eggs, egg whites, egg replacements  Soup Homemade soup (using the recommended veggies and meat), low-sodium bouillon, low-sodium canned  Desserts Cookies, cakes, ice cream, pudding without  chocolate or nuts, candy without chocolate or nuts  Fats and Oils Butter, margarine, cream, oil, salad dressing, mayo  Other Foods Unsalted potato chips or pretzels, herbs (like garlic, garlic powder, onion powder), lemon juice, salt-free seasoning blends, vinegar  Other Foods Low in Oxalate Foods You Can Eat  Drinks Beer, cola, wine, buttermilk, lemonade or limeade (without added vitamin C), milk  Meat, Meat Replacements, Fish, [employer] meat, ham, bacon, hot dogs, bratwurst, sausage, chicken nuggets, cheddar cheese, canned fish and shellfish  Soup Tomato soup, cheese soup  Other Foods Coconuts, lemon or lime juices, sugar or sweeteners, jellies or jams (from the recommended list)   Moderate-Oxalate Foods Foods to Limit   Drinks Fruit and veggie juices (from the list below), chocolate milk, rice milk, hot cocoa, tea   Fruits Blackberries, blueberries, black currants, cherries (sour), fruit cocktail, mangoes, orange peel, prunes, purple plums   Veggies Baked beans, carrots, celery, green beans, parsnips, summer squash, tomatoes, turnips   Breads, Cereals, Grains White bread, cornbread or cornmeal, white English muffins, saltine or soda crackers, brown rice, vanilla wafers, spaghetti and other noodles, firm tofu, bagels, oatmeal   Meat/meat replacements, fish,  poultry Sardines   Desserts Chocolate cake   Fats and Oils Macadamia nuts, pistachio nuts, English walnuts   Other Foods Jams or jellies (made with the fruits above), pepper    High-Oxalate Foods Foods to Avoid Drinks Chocolate drink mixes, soy milk, Ovaltine, instant iced tea, fruit juices of fruits listed below Fruits Apricots (dried), red currants, figs, kiwi, plums, rhubarb Veggies Beans (wax, dried), beets and beet greens, chives, collard greens, eggplant, escarole, dark greens of all kinds, leeks, okra, parsley, rutabagas, spinach, Swiss chard, tomato paste, watercress Breads, Cereals, Grains Amaranth, barley, white corn  flour, fried potatoes, fruitcake, grits, soybean products, sweet potatoes, wheat germ and bran, buckwheat flour, All Bran cereal, graham crackers, pretzels, whole wheat bread Meat/meat replacements, fish, poultry  Dried beans, peanut butter, soy burgers, miso  Desserts Carob, chocolate, marmalades Fats and Oils Nuts (peanuts, almonds, pecans, cashews, hazelnuts), nut butters, sesame seeds, tahini paste Other Foods Poppy seeds   Electronically signed by: Lauretta Ponto, MSN, FNP-C, CUNP 04/13/2024 10:42 AM

## 2024-04-13 ENCOUNTER — Encounter: Payer: Self-pay | Admitting: Urology

## 2024-04-13 ENCOUNTER — Ambulatory Visit: Admitting: Urology

## 2024-04-13 VITALS — BP 123/71 | HR 88

## 2024-04-13 DIAGNOSIS — K5900 Constipation, unspecified: Secondary | ICD-10-CM | POA: Diagnosis not present

## 2024-04-13 DIAGNOSIS — R1030 Lower abdominal pain, unspecified: Secondary | ICD-10-CM | POA: Diagnosis not present

## 2024-04-13 DIAGNOSIS — N200 Calculus of kidney: Secondary | ICD-10-CM

## 2024-04-13 LAB — URINALYSIS, ROUTINE W REFLEX MICROSCOPIC
Bilirubin, UA: NEGATIVE
Glucose, UA: NEGATIVE
Ketones, UA: NEGATIVE
Leukocytes,UA: NEGATIVE
Nitrite, UA: NEGATIVE
Protein,UA: NEGATIVE
RBC, UA: NEGATIVE
Specific Gravity, UA: 1.03 (ref 1.005–1.030)
Urobilinogen, Ur: 0.2 mg/dL (ref 0.2–1.0)
pH, UA: 5.5 (ref 5.0–7.5)

## 2024-04-13 NOTE — Patient Instructions (Signed)

## 2024-04-21 ENCOUNTER — Ambulatory Visit (HOSPITAL_COMMUNITY)

## 2024-08-26 ENCOUNTER — Ambulatory Visit
Admission: EM | Admit: 2024-08-26 | Discharge: 2024-08-26 | Disposition: A | Discharge status: Home / Self Care | Attending: Nurse Practitioner | Admitting: Nurse Practitioner

## 2024-08-26 DIAGNOSIS — T7840XA Allergy, unspecified, initial encounter: Secondary | ICD-10-CM | POA: Diagnosis not present

## 2024-08-26 MED ORDER — FAMOTIDINE 20 MG PO TABS: 20.0000 mg | ORAL_TABLET | Freq: Two times a day (BID) | ORAL | 0 refills | Status: DC

## 2024-08-26 MED ORDER — PREDNISONE 20 MG PO TABS: 40.0000 mg | ORAL_TABLET | Freq: Every day | ORAL | 0 refills | Status: AC

## 2024-08-26 MED ORDER — DEXAMETHASONE SODIUM PHOSPHATE 10 MG/ML IJ SOLN
10.0000 mg | INTRAMUSCULAR | Status: AC
Start: 2024-08-26 — End: 2024-08-26
  Administered 2024-08-26: 10 mg via INTRAMUSCULAR

## 2024-08-26 NOTE — ED Provider Notes (Signed)
 [facility]    CSN: [id number] Arrival date & time: 08/26/24  1736      History   Chief Complaint No chief complaint on file.   HPI Mark Irwin is a 23 y.o. male.   The history is provided by the patient.   Patient presents today with 3-day history of allergic reaction to his lips, face, ears. He reports lips swelling, lip redness and numbness in the face, lips and hands Reports he has been having head pressure, neck stiffness and chest tightness that started today. Patient denies rash or itching. Reports he has been taking cetirizine  and loratadine for his symptoms. Patient denies recent new medications or supplements, no new detergents or soaps. Patient denies history of food allergies and has not eaten anything out of the ordinary for him. Reports he has a history of similar allergic reactions states that he believes this has been caused by pet dander.    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 Authorizing Provider  albuterol  (VENTOLIN  HFA) 108 (90 Base) MCG/ACT inhaler Inhale 2 puffs into the lungs every 6 (six) hours as needed (exercising). 05/02/21   Waddell Catholic M, DO  naproxen  (NAPROSYN ) 500 MG tablet Take 1 tablet (500 mg total) by mouth 2 (two) times daily. 04/04/24   Leath-Warren, Etta PARAS, NP  senna-docusate (SENOKOT-S) 8.6-50 MG tablet Take 1 tablet by mouth daily. 04/04/24   Leath-Warren, Etta PARAS, NP  tamsulosin  (FLOMAX ) 0.4 MG CAPS capsule Take 1 capsule (0.4 mg total) by mouth at bedtime. 04/04/24   Leath-Warren, Etta PARAS, NP  loratadine (CLARITIN) 10 MG tablet Take 10 mg by mouth  daily. Patient not taking: Reported on 06/27/2020  08/22/20  [provider]    Family History History reviewed. No pertinent family history.  Social History Social History   Tobacco Use   Smoking status: Never   Smokeless tobacco: Never  Substance Use Topics   Alcohol use: Never   Drug use: Never     Allergies   Bee venom, Wasp venom, Doxycycline , Amoxil [amoxicillin], Cats claw [uncaria tomentosa (cats claw)], and Ibuprofen    Review of Systems Review of Systems Per HPI  Physical Exam Triage Vital Signs ED Triage Vitals  Encounter Vitals Group     BP 08/26/24 1752 121/72     Girls Systolic BP Percentile --      Girls Diastolic BP Percentile --      Boys Systolic BP Percentile --      Boys Diastolic BP Percentile --      Pulse Rate 08/26/24 1752 81     Resp 08/26/24 1752 16     Temp 08/26/24 1752 98.9 F (37.2 C)     Temp Source 08/26/24 1752 Oral     SpO2 08/26/24 1752 96 %     Weight --      Height --      Head Circumference --      Peak Flow --      Pain Score 08/26/24 1753 0  Pain Loc --      Pain Education --      Exclude from Growth Chart --    No data found.  Updated Vital Signs BP 121/72 (BP Location: Right Arm)   Pulse 81   Temp 98.9 F (37.2 C) (Oral)   Resp 16   SpO2 96%   Visual Acuity Right Eye Distance:   Left Eye Distance:   Bilateral Distance:    Right Eye Near:   Left Eye Near:    Bilateral Near:     Physical Exam Vitals and nursing note reviewed.  Constitutional:      General: He is not in acute distress.    Appearance: Normal appearance. He is well-developed.  HENT:     Head: Normocephalic and atraumatic.     Right Ear: Tympanic membrane, ear canal and external ear normal.     Left Ear: Tympanic membrane, ear canal and external ear normal.  Eyes:     Conjunctiva/sclera: Conjunctivae normal.  Cardiovascular:     Rate and Rhythm: Normal rate and regular rhythm.     Heart sounds: No murmur heard. Pulmonary:      Effort: Pulmonary effort is normal. No respiratory distress.     Breath sounds: Normal breath sounds.  Abdominal:     Palpations: Abdomen is soft.     Tenderness: There is no abdominal tenderness.  Musculoskeletal:        General: No swelling.     Cervical back: Neck supple.  Skin:    General: Skin is warm and dry.     Capillary Refill: Capillary refill takes less than 2 seconds.  Neurological:     Mental Status: He is alert.  Psychiatric:        Mood and Affect: Mood normal.      UC Treatments / Results  Labs (all labs ordered are listed, but only abnormal results are displayed) Labs Reviewed - No data to display  EKG   Radiology No results found.  Procedures Procedures (including critical care time)  Medications Ordered in UC Medications  dexamethasone  (DECADRON ) injection 10 mg (has no administration in time range)    Initial Impression / Assessment and Plan / UC Course  I have reviewed the triage vital signs and the nursing notes.  Pertinent labs & imaging results that were available during my care of the patient were reviewed by me and considered in my medical decision making (see chart for details).  Decadron  10 mg IM administered for allergic reaction.  Patient assumes symptoms were caused by pet dander, but he is not sure of the true etiology of his symptoms.  Decadron  10 mg IM administered for allergic reaction.  Will start patient on prednisone  40 mg for the next 5 days, and famotidine  10 mg.  Patient will continue cetirizine  he is currently taking.  Supportive care recommendations were provided and discussed with the patient to include avoiding allergy triggers, fluids, and rest.  Discussion with patient regarding strong recommendation to follow-up with allergist for further evaluation.  Patient was given strict ER follow-up precautions.  Patient was in agreement with this plan of care and verbalizes understanding.  All questions were answered.  Patient  stable for discharge.  Final Clinical Impressions(s) / UC Diagnoses   Final diagnoses:  None   Discharge Instructions   None    ED Prescriptions   None    PDMP not reviewed this encounter.   Gilmer Etta PARAS, NP 08/26/24 1825

## 2024-08-26 NOTE — Discharge Instructions (Addendum)
 You have been given an injection of Decadron  10 mg. Take medication as prescribed. Continue cetirizine  or loratidine daily while symptoms persist.  Start the prednisone  tomorrow. Avoid possible allergy triggers. As discussed, recommend following up with an allergist for further evaluation and management. Go to the emergency department if you experience shortness of breath, difficulty breathing, wheezing, or other concerns. Follow-up as needed.

## 2024-08-26 NOTE — ED Triage Notes (Signed)
 Pt reports allergic reaction, x 3 days, numbness in the face lips and hands. Pressure in the head, stiffness in the neck x 2 days and tightness in chest onset today. Pt thinks it may be caused by pat dander.

## 2024-09-06 ENCOUNTER — Ambulatory Visit
Admission: EM | Admit: 2024-09-06 | Discharge: 2024-09-06 | Disposition: A | Discharge status: Home / Self Care | Attending: Nurse Practitioner | Admitting: Nurse Practitioner

## 2024-09-06 DIAGNOSIS — R300 Dysuria: Secondary | ICD-10-CM

## 2024-09-06 DIAGNOSIS — R102 Pelvic and perineal pain unspecified side: Secondary | ICD-10-CM

## 2024-09-06 DIAGNOSIS — R350 Frequency of micturition: Secondary | ICD-10-CM

## 2024-09-06 LAB — POCT URINE DIPSTICK
Bilirubin, UA: NEGATIVE
Blood, UA: NEGATIVE
Glucose, UA: NEGATIVE mg/dL
Ketones, POC UA: NEGATIVE mg/dL
Leukocytes, UA: NEGATIVE
Nitrite, UA: NEGATIVE
POC PROTEIN,UA: NEGATIVE
Spec Grav, UA: 1.015 (ref 1.010–1.025)
Urobilinogen, UA: 0.2 U/dL
pH, UA: 6.5 (ref 5.0–8.0)

## 2024-09-06 MED ORDER — SULFAMETHOXAZOLE-TRIMETHOPRIM 800-160 MG PO TABS: 1.0000 | ORAL_TABLET | Freq: Two times a day (BID) | ORAL | 0 refills | Status: AC

## 2024-09-06 NOTE — Discharge Instructions (Signed)
 The urinalysis was negative for signs of infection. Given your symptoms, I have decided to treat you for possible prostatitis. Take medication as prescribed. Increase your fluid intake.  Try to drink at least 8-10 8 ounce glasses of water  daily. You may take over-the-counter Tylenol  or ibuprofen  as needed for pain, fever, or general discomfort. I would like for you to follow-up with urology within the next 7 to 10 days for reevaluation. Follow-up as needed.

## 2024-09-06 NOTE — ED Provider Notes (Signed)
 [facility]    CSN: [id number] Arrival date & time: 09/06/24  1451      History   Chief Complaint No chief complaint on file.   HPI Mark Irwin is a 23 y.o. male.   The history is provided by the patient.   Patient presents with 4-day history of pain with urination, weak urine stream, low back pain, urinary urgency, and pelvic discomfort.  Patient denies fever, chills, abdominal pain, nausea, vomiting, flank pain, or hematuria.  Patient with prior history of kidney stone.  States that he believes that he passed the last kidney stone.  Patient states he is concerned that he may have a UTI.  Patient has been seen by urology in the past.  Patient reports that he is not sexually active.  Patient reports that he has been hydrating well.  Past Medical History:  Diagnosis Date  . Asthma    exercise induced   . Renal disorder    kidney stone    Patient Active Problem List   Diagnosis Date Noted  . 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 Kidney stone 10/31/2016    Past Surgical History:  Procedure Laterality Date  . WISDOM TOOTH EXTRACTION         Home Medications    Prior to Admission medications   Medication Sig Start Date End Date Taking? Authorizing Provider  sulfamethoxazole -trimethoprim  (BACTRIM  DS) 800-160 MG tablet Take 1 tablet by mouth 2 (two) times daily for 10 days. 09/06/24 09/16/24 Yes Leath-Warren, Etta PARAS, NP  albuterol  (VENTOLIN  HFA) 108 (90 Base) MCG/ACT inhaler Inhale 2 puffs into the lungs every 6 (six) hours as needed (exercising). 05/02/21   Waddell Catholic M, DO  famotidine  (PEPCID ) 20 MG tablet Take 1 tablet (20 mg total) by mouth 2 (two) times daily. 08/26/24   Leath-Warren, Etta PARAS, NP  naproxen  (NAPROSYN ) 500 MG tablet Take 1 tablet (500 mg total) by mouth 2 (two) times daily. 04/04/24   Leath-Warren, Etta PARAS, NP  senna-docusate (SENOKOT-S) 8.6-50 MG tablet Take 1 tablet by mouth daily. 04/04/24   Leath-Warren, Etta PARAS, NP   tamsulosin  (FLOMAX ) 0.4 MG CAPS capsule Take 1 capsule (0.4 mg total) by mouth at bedtime. 04/04/24   Leath-Warren, Etta PARAS, NP  loratadine (CLARITIN) 10 MG tablet Take 10 mg by mouth daily. Patient not taking: Reported on 06/27/2020  08/22/20  [provider]    Family History No family history on file.  Social History Social History   Tobacco Use  . Smoking status: Never  . Smokeless tobacco: Never  Substance Use Topics  . Alcohol use: Never  . Drug use: Never     Allergies   Bee venom, Wasp venom, Doxycycline , Amoxil [amoxicillin], Cats claw [uncaria tomentosa (cats claw)], and Ibuprofen    Review of Systems Review of Systems Per HPI  Physical Exam Triage Vital Signs ED Triage Vitals  Encounter Vitals Group     BP 09/06/24 1458 128/82     Girls Systolic BP Percentile --      Girls Diastolic BP Percentile --      Boys Systolic BP Percentile --      Boys Diastolic BP Percentile --      Pulse Rate 09/06/24 1458 71     Resp 09/06/24 1458 18     Temp 09/06/24 1458 97.7 F (36.5 C)     Temp Source 09/06/24 1458 Oral     SpO2 09/06/24 1458 96 %     Weight --  Height --      Head Circumference --      Peak Flow --      Pain Score 09/06/24 1501 3     Pain Loc --      Pain Education --      Exclude from Growth Chart --    No data found.  Updated Vital Signs BP 128/82 (BP Location: Right Arm)   Pulse 71   Temp 97.7 F (36.5 C) (Oral)   Resp 18   SpO2 96%   Visual Acuity Right Eye Distance:   Left Eye Distance:   Bilateral Distance:    Right Eye Near:   Left Eye Near:    Bilateral Near:     Physical Exam Vitals and nursing note reviewed.  Constitutional:      General: He is not in acute distress.    Appearance: Normal appearance.  HENT:     Head: Normocephalic.     Mouth/Throat:     Mouth: Mucous membranes are moist.  Eyes:     Extraocular Movements: Extraocular movements intact.     Pupils: Pupils are equal, round, and reactive to  light.  Cardiovascular:     Rate and Rhythm: Normal rate and regular rhythm.     Pulses: Normal pulses.     Heart sounds: Normal heart sounds.  Pulmonary:     Effort: Pulmonary effort is normal. No respiratory distress.     Breath sounds: Normal breath sounds. No stridor. No wheezing, rhonchi or rales.  Abdominal:     General: Bowel sounds are normal.     Palpations: Abdomen is soft.  Genitourinary:    Comments: Deferred Musculoskeletal:     Cervical back: Normal range of motion.  Skin:    General: Skin is warm and dry.  Neurological:     General: No focal deficit present.     Mental Status: He is alert and oriented to person, place, and time.  Psychiatric:        Mood and Affect: Mood normal.        Behavior: Behavior normal.      UC Treatments / Results  Labs (all labs ordered are listed, but only abnormal results are displayed) Labs Reviewed  POCT URINE DIPSTICK    EKG   Radiology No results found.  Procedures Procedures (including critical care time)  Medications Ordered in UC Medications - No data to display  Initial Impression / Assessment and Plan / UC Course  I have reviewed the triage vital signs and the nursing notes.  Pertinent labs & imaging results that were available during my care of the patient were reviewed by me and considered in my medical decision making (see chart for details).  Urinalysis is negative for signs of infection.  Patient with prior history of kidney stones, but urinalysis is negative.  Concern for possible prostatitis.  Will cover patient for prostatitis empirically with Bactrim  DS for the next 7 days.  Will have patient follow-up with urology in the interim for further evaluation.  Supportive care recommendations were provided discussed with the patient to include fluids, rest, over-the-counter analgesics, and developing a toileting schedule.  Will recommend patient follow-up with urology within the next 5 to 7 days for  reevaluation.  Patient was in agreement with this plan of care and verbalizes understanding.  All questions were answered.  Patient stable for discharge.   Final Clinical Impressions(s) / UC Diagnoses   Final diagnoses:  Dysuria  Urinary frequency  Pelvic pain  Discharge Instructions      The urinalysis was negative for signs of infection. Given your symptoms, I have decided to treat you for possible prostatitis. Take medication as prescribed. Increase your fluid intake.  Try to drink at least 8-10 8 ounce glasses of water  daily. You may take over-the-counter Tylenol  or ibuprofen  as needed for pain, fever, or general discomfort. I would like for you to follow-up with urology within the next 7 to 10 days for reevaluation. Follow-up as needed.     ED Prescriptions     Medication Sig Dispense Auth. Provider   sulfamethoxazole -trimethoprim  (BACTRIM  DS) 800-160 MG tablet Take 1 tablet by mouth 2 (two) times daily for 10 days. 20 tablet Leath-Warren, Etta PARAS, NP      PDMP not reviewed this encounter.   Gilmer Etta PARAS, NP 09/06/24 1621

## 2024-09-06 NOTE — ED Triage Notes (Signed)
 Pt reports he has a weak stream, low back pain , frequent urination, urgency, weakness in genitals x 4 days

## 2024-09-08 ENCOUNTER — Emergency Department

## 2024-09-08 ENCOUNTER — Other Ambulatory Visit: Payer: Self-pay

## 2024-09-08 ENCOUNTER — Emergency Department
Admission: EM | Admit: 2024-09-08 | Discharge: 2024-09-08 | Disposition: A | Discharge status: Home / Self Care | Attending: Emergency Medicine | Admitting: Emergency Medicine

## 2024-09-08 DIAGNOSIS — F419 Anxiety disorder, unspecified: Secondary | ICD-10-CM | POA: Insufficient documentation

## 2024-09-08 DIAGNOSIS — R079 Chest pain, unspecified: Secondary | ICD-10-CM | POA: Diagnosis present

## 2024-09-08 DIAGNOSIS — R000 Tachycardia, unspecified: Secondary | ICD-10-CM | POA: Insufficient documentation

## 2024-09-08 DIAGNOSIS — Z87442 Personal history of urinary calculi: Secondary | ICD-10-CM | POA: Diagnosis not present

## 2024-09-08 DIAGNOSIS — J45909 Unspecified asthma, uncomplicated: Secondary | ICD-10-CM | POA: Insufficient documentation

## 2024-09-08 DIAGNOSIS — R0602 Shortness of breath: Secondary | ICD-10-CM | POA: Diagnosis not present

## 2024-09-08 DIAGNOSIS — R531 Weakness: Secondary | ICD-10-CM | POA: Insufficient documentation

## 2024-09-08 LAB — BASIC METABOLIC PANEL WITH GFR
Anion gap: 12 (ref 5–15)
BUN: 16 mg/dL (ref 6–20)
CO2: 26 mmol/L (ref 22–32)
Calcium: 9.3 mg/dL (ref 8.9–10.3)
Chloride: 100 mmol/L (ref 98–111)
Creatinine, Ser: 1.36 mg/dL — ABNORMAL HIGH (ref 0.61–1.24)
GFR, Estimated: >60 mL/min (ref >60)
Glucose, Bld: 105 mg/dL — ABNORMAL HIGH (ref 70–99)
Potassium: 3.5 mmol/L (ref 3.5–5.1)
Sodium: 138 mmol/L (ref 135–145)

## 2024-09-08 LAB — CBC
HCT: 44.2 % (ref 39.0–52.0)
Hemoglobin: 14.8 g/dL (ref 13.0–17.0)
MCH: 29.4 pg (ref 26.0–34.0)
MCHC: 33.5 g/dL (ref 30.0–36.0)
MCV: 87.7 fL (ref 80.0–100.0)
Platelets: 195 K/uL (ref 150–400)
RBC: 5.04 MIL/uL (ref 4.22–5.81)
RDW: 12.2 % (ref 11.5–15.5)
WBC: 6.6 K/uL (ref 4.0–10.5)
nRBC: 0 % (ref 0.0–0.2)

## 2024-09-08 LAB — TROPONIN I (HIGH SENSITIVITY): Troponin I (High Sensitivity): <2 ng/L (ref <18)

## 2024-09-08 NOTE — ED Triage Notes (Signed)
 Pt comes with heart racing, breathing quick, shaky and foggy. Pt states he had been having frequent urination, urgency and weakness for few days. Pt went to UC on 12th and just started meds last night. Pt states he now thinks he might be septic.

## 2024-09-08 NOTE — Discharge Instructions (Signed)
 Your EKG, chest x-ray and blood work was reassuring today.  Based on your vital signs and your blood work there is no indication that you are septic.  Please continue to take the antibiotics as prescribed.  If they were not treating your infection appropriately I would expect to see an elevated white blood cell count which you do not have and you would likely have a fever.  If you have worsening symptoms like burning with urination, fever, pelvic pain or back plain please return to the emergency department for reevaluation.

## 2024-09-08 NOTE — ED Notes (Signed)
 See triage note  Presents with not feeling well  Weakness and being shaky States he was recently placed on antibiotics for prostatitis  States he just started them yesterday

## 2024-09-08 NOTE — ED Provider Notes (Signed)
 [facility] Provider Note    Event Date/Time   First MD Initiated Contact with Patient 09/08/24 1529     (approximate)   History   Chest Pain   HPI  Mark Irwin is a 23 y.o. male with PMH of kidney stone and asthma presents for evaluation of chest pain.  Patient was seen at urgent care 2 days ago for evaluation of urinary frequency and diagnosed with prostatitis.  He began taking his antibiotics last night.  Patient states that today at work he began to feel some weakness and later felt shaky.  He also felt like he could not catch his breath and his heart was racing.  Endorsed feeling some mental fog.  Patient came to the emergency department because he is concerned that the prostatitis has become worse and that he is septic.      Physical Exam   Triage Vital Signs: ED Triage Vitals  Encounter Vitals Group     BP 09/08/24 1343 139/81     Girls Systolic BP Percentile --      Girls Diastolic BP Percentile --      Boys Systolic BP Percentile --      Boys Diastolic BP Percentile --      Pulse Rate 09/08/24 1343 (!) 113     Resp 09/08/24 1343 19     Temp 09/08/24 1343 98 F (36.7 C)     Temp src --      SpO2 09/08/24 1343 100 %     Weight 09/08/24 1341 150 lb (68 kg)     Height 09/08/24 1341 6' 1 (1.854 m)     Head Circumference --      Peak Flow --      Pain Score 09/08/24 1341 0     Pain Loc --      Pain Education --      Exclude from Growth Chart --     Most recent vital signs: Vitals:   09/08/24 1343 09/08/24 1551  BP: 139/81   Pulse: (!) 113 78  Resp: 19   Temp: 98 F (36.7 C)   SpO2: 100%    General: Awake, no distress.  CV:  Good peripheral perfusion.  RRR. Resp:  Normal effort.  CTAB Abd:  No distention.  Other:  Prostate exam deferred.   ED Results / Procedures / Treatments   Labs (all labs ordered are listed, but only abnormal results are displayed) Labs Reviewed  BASIC METABOLIC PANEL WITH GFR - Abnormal; Notable  for the following components:      Result Value   Glucose, Bld 105 (*)    Creatinine, Ser 1.36 (*)    All other components within normal limits  CBC  TROPONIN I (HIGH SENSITIVITY)  TROPONIN I (HIGH SENSITIVITY)     EKG  ED provider interpretation: Normal sinus rhythm, no ST changes.  Vent. rate 82 BPM PR interval 142 ms QRS duration 90 ms QT/QTcB 342/399 ms P-R-T axes 74 85 43  RADIOLOGY  Chest x-ray obtained, I interpreted the images as well as reviewed the radiologist report, which was negative for any acute cardiopulmonary abnormalities.  PROCEDURES:  Critical Care performed: No  Procedures   MEDICATIONS ORDERED IN ED: Medications - No data to display   IMPRESSION / MDM / ASSESSMENT AND PLAN / ED COURSE  I reviewed the triage vital signs and the nursing notes.  23 year old male presents for evaluation of chest pain, shortness of breath, heart racing and weakness.  Patient was tachycardic upon initial assessment but this has returned to normal.  Vital signs stable otherwise.  Patient NAD on exam.  Differential diagnosis includes, but is not limited to, anxiety, panic attack, cardiac arrhythmia, ACS, PE, pneumonia, pneumothorax, worsening prostatitis.  Patient's presentation is most consistent with acute complicated illness / injury requiring diagnostic workup.  CBC unremarkable, no leukocytosis.  BMP shows mildly elevated creatinine, otherwise normal.  Troponin is not elevated.  Chest x-ray is negative.  EKG shows normal sinus rhythm.  Based on EKG and negative troponin do not suspect ACS.  Based on [facility] criteria can rule out PE as a cause.  No evidence of pneumonia or pneumothorax on chest x-ray.  No cardiac arrhythmia on EKG, although it is possible that this may have been missed if it is intermittent.  At the time of my assessment patient reports that his symptoms have significantly improved.  Suspect that patient's symptoms are  anxiety related.  Patient states that he had a family member die of sepsis.  His greatest concern was that he had not began taking the antibiotic soon enough and that his prostatitis was getting worse and that he was becoming septic as a result of that.  Patient was given reassurance regarding this.  I explained that he does not meet SIRS criteria and that I do not think he is septic.  I explained that he does not have an elevated white blood cell count so I do not think that the infection is getting worse.  He was encouraged to continue taking the antibiotics as prescribed.  Since patient's creatinine was elevated, did offer to give him some IV fluids but patient would prefer to just drink extra water  at home which I felt was appropriate.  Patient voiced understanding, all questions were answered and he was stable at discharge.     FINAL CLINICAL IMPRESSION(S) / ED DIAGNOSES   Final diagnoses:  Anxiety     Rx / DC Orders   ED Discharge Orders     None        Note:  This document was prepared using Dragon voice recognition software and may include unintentional dictation errors.   Cleaster Tinnie LABOR, PA-C 09/08/24 1601    Dorothyann Drivers, MD 09/08/24 2224

## 2024-09-15 ENCOUNTER — Ambulatory Visit (HOSPITAL_COMMUNITY)
Admission: RE | Admit: 2024-09-15 | Discharge: 2024-09-15 | Disposition: A | Discharge status: Home / Self Care | Source: Ambulatory Visit | Attending: Urology | Admitting: Urology

## 2024-09-15 ENCOUNTER — Ambulatory Visit (INDEPENDENT_AMBULATORY_CARE_PROVIDER_SITE_OTHER): Admitting: Urology

## 2024-09-15 VITALS — BP 113/68 | HR 91

## 2024-09-15 DIAGNOSIS — N410 Acute prostatitis: Secondary | ICD-10-CM

## 2024-09-15 DIAGNOSIS — N200 Calculus of kidney: Secondary | ICD-10-CM | POA: Insufficient documentation

## 2024-09-15 DIAGNOSIS — Z87438 Personal history of other diseases of male genital organs: Secondary | ICD-10-CM | POA: Diagnosis not present

## 2024-09-15 NOTE — Progress Notes (Signed)
 HPI: This nice 23 year old male comes in today with recent history of prostatitis.  Previously seen here in May of this year by Lauraine Oz, NP.  I was following a stone episode.  He is apparently passed a few stones in his life, each 1 was productive of different symptomatology.  Recently, he developed frequency, urgency, intermittency, feeling of incomplete emptying as well as bilateral groin pain.  Perhaps also some back pain.  Was placed on Bactrim  for presumed prostatitis.  Symptoms are improving somewhat.  He has had no recent gross hematuria.  He has had no fever.  KUB from May of this year revealed a 4 mm right renal calculus.   PMH: Past Medical History:  Diagnosis Date   Asthma    exercise induced    Renal disorder    kidney stone    Surgical History: Past Surgical History:  Procedure Laterality Date   WISDOM TOOTH EXTRACTION      Home Medications:  Allergies as of 09/15/2024       Reactions   Bee Venom Anaphylaxis   Wasp Venom Anaphylaxis   Doxycycline     Broke out with rash as well as hives after taking doxycycline  for 1 day   Amoxil [amoxicillin] Hives   Has patient had a PCN reaction causing immediate rash, facial/tongue/throat swelling, SOB or lightheadedness with hypotension: Yes Has patient had a PCN reaction causing severe rash involving mucus membranes or skin necrosis: No Has patient had a PCN reaction that required hospitalization: No Has patient had a PCN reaction occurring within the last 10 years: Yes If all of the above answers are NO, then may proceed with Cephalosporin use.   Cats Claw [uncaria Tomentosa (cats Claw)] Itching   Ibuprofen  Other (See Comments)   Finger tips swell         Medication List        Accurate as of September 15, 2024  2:41 PM. If you have any questions, ask your nurse or doctor.          albuterol  108 (90 Base) MCG/ACT inhaler Commonly known as: VENTOLIN  HFA Inhale 2 puffs into the lungs every 6  (six) hours as needed (exercising).   famotidine  20 MG tablet Commonly known as: PEPCID  Take 1 tablet (20 mg total) by mouth 2 (two) times daily.   naproxen  500 MG tablet Commonly known as: NAPROSYN  Take 1 tablet (500 mg total) by mouth 2 (two) times daily.   senna-docusate 8.6-50 MG tablet Commonly known as: Senokot-S Take 1 tablet by mouth daily.   sulfamethoxazole -trimethoprim  800-160 MG tablet Commonly known as: BACTRIM  DS Take 1 tablet by mouth 2 (two) times daily for 10 days.   tamsulosin  0.4 MG Caps capsule Commonly known as: FLOMAX  Take 1 capsule (0.4 mg total) by mouth at bedtime.        Allergies:  Allergies  Allergen Reactions   Bee Venom Anaphylaxis   Wasp Venom Anaphylaxis   Doxycycline      Broke out with rash as well as hives after taking doxycycline  for 1 day   Amoxil [Amoxicillin] Hives    Has patient had a PCN reaction causing immediate rash, facial/tongue/throat swelling, SOB or lightheadedness with hypotension: Yes Has patient had a PCN reaction causing severe rash involving mucus membranes or skin necrosis: No Has patient had a PCN reaction that required hospitalization: No Has patient had a PCN reaction occurring within the last 10 years: Yes If all of the above answers are NO, then  may proceed with Cephalosporin use.    Cats Claw [Uncaria Tomentosa (Cats Claw)] Itching   Ibuprofen  Other (See Comments)    Finger tips swell     Family History: No family history on file.  Social History:  reports that he has never smoked. He has never used smokeless tobacco. He reports that he does not drink alcohol and does not use drugs.  ROS: All other review of systems were reviewed and are negative except what is noted above in HPI  Physical Exam: There were no vitals taken for this visit.  Constitutional:  Alert and oriented, No acute distress. HEENT: [place] AT, moist mucus membranes.  Trachea midline, no masses. Cardiovascular: No clubbing, cyanosis, or  edema. Respiratory: Normal respiratory effort, no increased work of breathing. GI: No inguinal hernias GU: Normal phallus. No masses/lesions on penis, testis, scrotum. Prostate 20 g smooth no nodules no induration.  Lymph: No cervical or inguinal lymphadenopathy. Skin: No rashes, bruises or suspicious lesions. Neurologic: Grossly intact, no focal deficits, moving all 4 extremities. Psychiatric: Normal mood and affect.  Laboratory Data: Lab Results  Component Value Date   WBC 6.6 09/08/2024   HGB 14.8 09/08/2024   HCT 44.2 09/08/2024   MCV 87.7 09/08/2024   PLT 195 09/08/2024    Lab Results  Component Value Date   CREATININE 1.36 (H) 09/08/2024    KUB from May of this year reviewed with the patient-4 mm right interpolar calcification Urinalysis Clear  Prior notes reviewed from this office  Recent ER notes reviewed    Assessment:  -Recent voiding symptoms, doubt infectious source as his urinalysis from the 12th of this month as well as today are normal  - Right renal calculus-question whether his symptoms could be related to calculus process  Plan:   -I will have patient sent for KUB today, call with results as well as follow-up plan  - Reassurance regarding the fact that I do not think he has an active infection There are no diagnoses linked to this encounter.  No follow-ups on file.  Garnette CHRISTELLA Shack, MD  [facility] 

## 2024-09-16 LAB — URINALYSIS, ROUTINE W REFLEX MICROSCOPIC
Bilirubin, UA: NEGATIVE
Glucose, UA: NEGATIVE
Ketones, UA: NEGATIVE
Leukocytes,UA: NEGATIVE
Nitrite, UA: NEGATIVE
Protein,UA: NEGATIVE
RBC, UA: NEGATIVE
Specific Gravity, UA: 1.015 (ref 1.005–1.030)
Urobilinogen, Ur: 1 mg/dL (ref 0.2–1.0)
pH, UA: 7.5 (ref 5.0–7.5)

## 2024-09-20 ENCOUNTER — Ambulatory Visit: Payer: Self-pay | Admitting: Urology

## 2024-10-11 ENCOUNTER — Other Ambulatory Visit: Payer: Self-pay | Admitting: Urology

## 2024-10-14 ENCOUNTER — Ambulatory Visit: Admitting: Urology

## 2024-10-17 LAB — LITHOLINK 24HR URINE PANEL
Ammonium, Urine: 53 mmol/(24.h) (ref 15–60)
Calcium Oxalate Saturation: 11.24 — ABNORMAL HIGH (ref 6.00–10.00)
Calcium Phosphate Saturation: 1.18 (ref 0.50–2.00)
Calcium, Urine: 240 mg/(24.h)
Calcium/Creatinine Ratio: 121 mg/g{creat} (ref 34–196)
Calcium/Kg Body Weight: 3.4 mg/kg/d
Chloride, Urine: 155 mmol/(24.h) (ref 70–250)
Citrate, Urine: 616 mg/(24.h)
Creatinine, Urine: 1982 mg/(24.h)
Creatinine/Kg Body Weight: 28.2 mg/kg/d — ABNORMAL HIGH (ref 11.9–24.4)
Cystine, Urine, Qualitative: NEGATIVE
Magnesium, Urine: 72 mg/(24.h) (ref 30–120)
Oxalate, Urine: 32 mg/(24.h) (ref 20–40)
Phosphorus, Urine: 1064 mg/(24.h) (ref 600–1200)
Potassium, Urine: 38 mmol/(24.h) (ref 20–100)
Protein Catabolic Rate: 1.1 g/kg/d (ref 0.8–1.4)
Sodium, Urine: 154 mmol/(24.h) — ABNORMAL HIGH (ref 50–150)
Sulfate, Urine: 42 meq/(24.h) (ref 20–80)
Urea Nitrogen, Urine: 9.82 g/(24.h) (ref 6.00–14.00)
Uric Acid Saturation: 2.69 — ABNORMAL HIGH
Uric Acid, Urine: 815 mg/(24.h) — ABNORMAL HIGH
Urine Volume (Preserved): 1160 mL/(24.h) (ref 500–4000)
pH, 24 hr, Urine: 5.614 — ABNORMAL LOW (ref 5.800–6.200)

## 2024-10-27 ENCOUNTER — Ambulatory Visit: Payer: Self-pay | Admitting: Urology

## 2024-11-11 ENCOUNTER — Ambulatory Visit
Admission: EM | Admit: 2024-11-11 | Discharge: 2024-11-11 | Disposition: A | Discharge status: Home / Self Care | Source: Home / Self Care

## 2024-11-11 ENCOUNTER — Encounter: Payer: Self-pay | Admitting: Emergency Medicine

## 2024-11-11 DIAGNOSIS — J4521 Mild intermittent asthma with (acute) exacerbation: Secondary | ICD-10-CM | POA: Diagnosis not present

## 2024-11-11 DIAGNOSIS — J208 Acute bronchitis due to other specified organisms: Secondary | ICD-10-CM

## 2024-11-11 MED ORDER — PREDNISONE 20 MG PO TABS: 40.0000 mg | ORAL_TABLET | Freq: Every day | ORAL | 0 refills | Status: AC

## 2024-11-11 MED ORDER — ALBUTEROL SULFATE HFA 108 (90 BASE) MCG/ACT IN AERS: 2.0000 | INHALATION_SPRAY | RESPIRATORY_TRACT | 0 refills | Status: AC | PRN

## 2024-11-11 MED ORDER — PROMETHAZINE-DM 6.25-15 MG/5ML PO SYRP: 5.0000 mL | ORAL_SOLUTION | Freq: Four times a day (QID) | ORAL | 0 refills | Status: AC | PRN

## 2024-11-11 NOTE — Discharge Instructions (Signed)
 In addition to the prescribed medications, you may use over-the-counter cold congestion medications, allergy medication as needed.  Follow-up for worsening or unresolving symptoms.

## 2024-11-11 NOTE — ED Triage Notes (Signed)
 Productive Cough, nasal drainage since [facility] Friday.  Feels a burning sensation in chest that started today.

## 2024-11-11 NOTE — ED Provider Notes (Signed)
 [facility]    CSN: [id number] Arrival date & time: 11/11/24  1803      History   Chief Complaint No chief complaint on file.   HPI Mark Irwin is a 23 y.o. male.   Patient presenting today with 2-week history of hacking cough, nasal congestion.  States symptoms started worsening the past few days and now feels a tight burning sensation in chest particularly with coughing.  Denies wheezing, shortness of breath, fever, abdominal pain, vomiting, diarrhea.  So far initially was trying DayQuil and NyQuil but now just trying Zyrtec .  History of asthma not currently on an inhaler.    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 Authorizing Provider  albuterol  (VENTOLIN  HFA) 108 (90 Base) MCG/ACT inhaler Inhale 2 puffs into the lungs every 4 (four) hours as needed. 11/11/24  Yes Stuart Vernell Norris, PA-C  predniSONE  (DELTASONE ) 20 MG tablet Take 2 tablets (40 mg total) by mouth daily with breakfast. 11/11/24  Yes Stuart Vernell Norris, PA-C  promethazine -dextromethorphan (PROMETHAZINE -DM) 6.25-15 MG/5ML syrup Take 5 mLs by mouth 4 (four) times daily as needed. 11/11/24  Yes Stuart Vernell Norris, PA-C  loratadine (CLARITIN) 10 MG tablet Take 10 mg by mouth daily. Patient not taking: Reported on 06/27/2020  08/22/20  [provider]    Family History History reviewed. No pertinent family history.  Social History Social History[1]   Allergies   Bee venom, Wasp venom, Doxycycline , Amoxil [amoxicillin], Cats claw [uncaria tomentosa (cats claw)], and Ibuprofen    Review of Systems Review of  Systems PER HPI  Physical Exam Triage Vital Signs ED Triage Vitals  Encounter Vitals Group     BP 11/11/24 1823 133/83     Girls Systolic BP Percentile --      Girls Diastolic BP Percentile --      Boys Systolic BP Percentile --      Boys Diastolic BP Percentile --      Pulse Rate 11/11/24 1823 86     Resp 11/11/24 1823 18     Temp 11/11/24 1823 98.4 F (36.9 C)     Temp Source 11/11/24 1823 Oral     SpO2 11/11/24 1823 98 %     Weight --      Height --      Head Circumference --      Peak Flow --      Pain Score 11/11/24 1825 3     Pain Loc --      Pain Education --      Exclude from Growth Chart --    No data found.  Updated Vital Signs BP 133/83 (BP Location: Right Arm)   Pulse 86   Temp 98.4 F (36.9 C) (Oral)   Resp 18   SpO2 98%   Visual Acuity Right Eye Distance:   Left Eye Distance:   Bilateral Distance:    Right Eye Near:   Left Eye Near:    Bilateral Near:     Physical Exam Vitals and nursing note reviewed.  Constitutional:  Appearance: He is well-developed.  HENT:     Head: Atraumatic.     Right Ear: External ear normal.     Left Ear: External ear normal.     Nose: Rhinorrhea present.     Mouth/Throat:     Pharynx: Posterior oropharyngeal erythema present. No oropharyngeal exudate.  Eyes:     Conjunctiva/sclera: Conjunctivae normal.     Pupils: Pupils are equal, round, and reactive to light.  Cardiovascular:     Rate and Rhythm: Normal rate and regular rhythm.  Pulmonary:     Effort: Pulmonary effort is normal. No respiratory distress.     Breath sounds: No wheezing or rales.  Musculoskeletal:        General: Normal range of motion.     Cervical back: Normal range of motion and neck supple.  Lymphadenopathy:     Cervical: No cervical adenopathy.  Skin:    General: Skin is warm and dry.  Neurological:     Mental Status: He is alert and oriented to person, place, and time.  Psychiatric:        Behavior: Behavior normal.       UC Treatments / Results  Labs (all labs ordered are listed, but only abnormal results are displayed) Labs Reviewed - No data to display  EKG   Radiology No results found.  Procedures Procedures (including critical care time)  Medications Ordered in UC Medications - No data to display  Initial Impression / Assessment and Plan / UC Course  I have reviewed the triage vital signs and the nursing notes.  Pertinent labs & imaging results that were available during my care of the patient were reviewed by me and considered in my medical decision making (see chart for details).     Vitals and exam overall reassuring today, suspect viral bronchitis/asthma exacerbation.  Treat with prednisone , albuterol , Phenergan  DM, supportive over-the-counter medications and home care.  Return for worsening or unresolving symptoms.  Final Clinical Impressions(s) / UC Diagnoses   Final diagnoses:  Viral bronchitis  Mild intermittent asthma with acute exacerbation     Discharge Instructions      In addition to the prescribed medications, you may use over-the-counter cold congestion medications, allergy medication as needed.  Follow-up for worsening or unresolving symptoms.   ED Prescriptions     Medication Sig Dispense Auth. Provider   predniSONE  (DELTASONE ) 20 MG tablet Take 2 tablets (40 mg total) by mouth daily with breakfast. 10 tablet Stuart Vernell Norris, PA-C   albuterol  (VENTOLIN  HFA) 108 (90 Base) MCG/ACT inhaler Inhale 2 puffs into the lungs every 4 (four) hours as needed. 18 g Stuart Vernell Norris, PA-C   promethazine -dextromethorphan (PROMETHAZINE -DM) 6.25-15 MG/5ML syrup Take 5 mLs by mouth 4 (four) times daily as needed. 100 mL Stuart Vernell Norris, [place]      PDMP not reviewed this encounter.    [1]  Social History Tobacco Use   Smoking status: Never   Smokeless tobacco: Never  Substance Use Topics   Alcohol use: Never   Drug use: Never     Stuart Vernell Norris, PA-C 11/11/24 1901
